# Patient Record
Sex: Female | Born: 1935 | Race: Black or African American | Hispanic: No | Smoking: Former smoker
Health system: Southern US, Community
[De-identification: ages and names within clinical notes are randomized; demographics above are authoritative.]

## PROBLEM LIST (undated history)

## (undated) DIAGNOSIS — E119 Type 2 diabetes mellitus without complications: Secondary | ICD-10-CM

## (undated) DIAGNOSIS — R159 Full incontinence of feces: Secondary | ICD-10-CM

## (undated) DIAGNOSIS — K589 Irritable bowel syndrome without diarrhea: Secondary | ICD-10-CM

## (undated) DIAGNOSIS — I10 Essential (primary) hypertension: Secondary | ICD-10-CM

## (undated) DIAGNOSIS — K5790 Diverticulosis of intestine, part unspecified, without perforation or abscess without bleeding: Secondary | ICD-10-CM

## (undated) DIAGNOSIS — C50919 Malignant neoplasm of unspecified site of unspecified female breast: Secondary | ICD-10-CM

## (undated) HISTORY — DX: Diverticulosis of intestine, part unspecified, without perforation or abscess without bleeding: K57.90

## (undated) HISTORY — PX: COLONOSCOPY: SHX174

## (undated) HISTORY — DX: Malignant neoplasm of unspecified site of unspecified female breast: C50.919

## (undated) HISTORY — DX: Type 2 diabetes mellitus without complications: E11.9

## (undated) HISTORY — DX: Irritable bowel syndrome, unspecified: K58.9

## (undated) HISTORY — DX: Full incontinence of feces: R15.9

## (undated) HISTORY — PX: BREAST LUMPECTOMY: SHX2

## (undated) HISTORY — PX: CHOLECYSTECTOMY: SHX55

## (undated) HISTORY — PX: APPENDECTOMY: SHX54

## (undated) HISTORY — DX: Essential (primary) hypertension: I10

---

## 1997-07-28 ENCOUNTER — Ambulatory Visit (HOSPITAL_COMMUNITY): Admission: RE | Admit: 1997-07-28 | Discharge: 1997-07-28 | Payer: Self-pay | Admitting: Internal Medicine

## 1997-10-14 ENCOUNTER — Other Ambulatory Visit: Admission: RE | Admit: 1997-10-14 | Discharge: 1997-10-14 | Payer: Self-pay | Admitting: *Deleted

## 1998-01-13 ENCOUNTER — Encounter: Payer: Self-pay | Admitting: Internal Medicine

## 1998-01-13 ENCOUNTER — Ambulatory Visit (HOSPITAL_COMMUNITY): Admission: RE | Admit: 1998-01-13 | Discharge: 1998-01-13 | Payer: Self-pay | Admitting: Internal Medicine

## 1998-12-07 ENCOUNTER — Encounter: Payer: Self-pay | Admitting: Internal Medicine

## 1998-12-07 ENCOUNTER — Ambulatory Visit (HOSPITAL_COMMUNITY): Admission: RE | Admit: 1998-12-07 | Discharge: 1998-12-07 | Payer: Self-pay | Admitting: Internal Medicine

## 1998-12-22 ENCOUNTER — Other Ambulatory Visit: Admission: RE | Admit: 1998-12-22 | Discharge: 1998-12-22 | Payer: Self-pay | Admitting: *Deleted

## 1999-02-17 ENCOUNTER — Other Ambulatory Visit: Admission: RE | Admit: 1999-02-17 | Discharge: 1999-02-17 | Payer: Self-pay | Admitting: *Deleted

## 1999-02-17 ENCOUNTER — Encounter (INDEPENDENT_AMBULATORY_CARE_PROVIDER_SITE_OTHER): Payer: Self-pay

## 1999-05-23 ENCOUNTER — Ambulatory Visit (HOSPITAL_COMMUNITY): Admission: RE | Admit: 1999-05-23 | Discharge: 1999-05-23 | Payer: Self-pay | Admitting: *Deleted

## 1999-05-23 ENCOUNTER — Encounter (INDEPENDENT_AMBULATORY_CARE_PROVIDER_SITE_OTHER): Payer: Self-pay | Admitting: Specialist

## 1999-12-13 ENCOUNTER — Encounter: Payer: Self-pay | Admitting: Internal Medicine

## 1999-12-13 ENCOUNTER — Ambulatory Visit (HOSPITAL_COMMUNITY): Admission: RE | Admit: 1999-12-13 | Discharge: 1999-12-13 | Payer: Self-pay | Admitting: Internal Medicine

## 1999-12-18 ENCOUNTER — Encounter: Admission: RE | Admit: 1999-12-18 | Discharge: 2000-03-17 | Payer: Self-pay | Admitting: Internal Medicine

## 2000-02-09 ENCOUNTER — Other Ambulatory Visit: Admission: RE | Admit: 2000-02-09 | Discharge: 2000-02-09 | Payer: Self-pay | Admitting: *Deleted

## 2000-09-02 ENCOUNTER — Encounter: Admission: RE | Admit: 2000-09-02 | Discharge: 2000-09-02 | Payer: Self-pay | Admitting: Internal Medicine

## 2000-09-02 ENCOUNTER — Encounter: Payer: Self-pay | Admitting: Internal Medicine

## 2001-01-14 ENCOUNTER — Encounter: Admission: RE | Admit: 2001-01-14 | Discharge: 2001-01-14 | Payer: Self-pay | Admitting: Internal Medicine

## 2001-01-14 ENCOUNTER — Encounter: Payer: Self-pay | Admitting: Internal Medicine

## 2001-01-22 ENCOUNTER — Encounter: Payer: Self-pay | Admitting: Internal Medicine

## 2001-01-22 ENCOUNTER — Encounter: Admission: RE | Admit: 2001-01-22 | Discharge: 2001-01-22 | Payer: Self-pay | Admitting: Internal Medicine

## 2001-04-29 ENCOUNTER — Other Ambulatory Visit: Admission: RE | Admit: 2001-04-29 | Discharge: 2001-04-29 | Payer: Self-pay | Admitting: *Deleted

## 2001-07-07 ENCOUNTER — Ambulatory Visit (HOSPITAL_COMMUNITY): Admission: RE | Admit: 2001-07-07 | Discharge: 2001-07-07 | Payer: Self-pay | Admitting: Gastroenterology

## 2002-03-26 ENCOUNTER — Encounter: Payer: Self-pay | Admitting: Internal Medicine

## 2002-03-26 ENCOUNTER — Encounter: Admission: RE | Admit: 2002-03-26 | Discharge: 2002-03-26 | Payer: Self-pay | Admitting: Internal Medicine

## 2002-09-03 ENCOUNTER — Other Ambulatory Visit: Admission: RE | Admit: 2002-09-03 | Discharge: 2002-09-03 | Payer: Self-pay | Admitting: *Deleted

## 2003-03-30 ENCOUNTER — Encounter: Admission: RE | Admit: 2003-03-30 | Discharge: 2003-03-30 | Payer: Self-pay | Admitting: Internal Medicine

## 2003-04-12 ENCOUNTER — Encounter: Admission: RE | Admit: 2003-04-12 | Discharge: 2003-04-12 | Payer: Self-pay | Admitting: Internal Medicine

## 2003-08-10 ENCOUNTER — Ambulatory Visit (HOSPITAL_COMMUNITY): Admission: RE | Admit: 2003-08-10 | Discharge: 2003-08-10 | Payer: Self-pay | Admitting: Cardiology

## 2003-10-20 ENCOUNTER — Encounter: Admission: RE | Admit: 2003-10-20 | Discharge: 2003-10-20 | Payer: Self-pay | Admitting: Internal Medicine

## 2004-01-31 ENCOUNTER — Ambulatory Visit (HOSPITAL_BASED_OUTPATIENT_CLINIC_OR_DEPARTMENT_OTHER): Admission: RE | Admit: 2004-01-31 | Discharge: 2004-01-31 | Payer: Self-pay | Admitting: *Deleted

## 2004-01-31 ENCOUNTER — Ambulatory Visit (HOSPITAL_COMMUNITY): Admission: RE | Admit: 2004-01-31 | Discharge: 2004-01-31 | Payer: Self-pay | Admitting: *Deleted

## 2004-01-31 ENCOUNTER — Encounter (INDEPENDENT_AMBULATORY_CARE_PROVIDER_SITE_OTHER): Payer: Self-pay | Admitting: Specialist

## 2004-03-17 ENCOUNTER — Ambulatory Visit (HOSPITAL_COMMUNITY): Admission: RE | Admit: 2004-03-17 | Discharge: 2004-03-17 | Payer: Self-pay | Admitting: Cardiology

## 2004-10-10 ENCOUNTER — Encounter: Admission: RE | Admit: 2004-10-10 | Discharge: 2004-10-10 | Payer: Self-pay | Admitting: Internal Medicine

## 2005-05-06 ENCOUNTER — Encounter: Admission: RE | Admit: 2005-05-06 | Discharge: 2005-05-06 | Payer: Self-pay | Admitting: General Surgery

## 2005-05-18 ENCOUNTER — Encounter: Admission: RE | Admit: 2005-05-18 | Discharge: 2005-05-18 | Payer: Self-pay | Admitting: General Surgery

## 2005-06-06 ENCOUNTER — Encounter (INDEPENDENT_AMBULATORY_CARE_PROVIDER_SITE_OTHER): Payer: Self-pay | Admitting: *Deleted

## 2005-06-06 ENCOUNTER — Ambulatory Visit (HOSPITAL_BASED_OUTPATIENT_CLINIC_OR_DEPARTMENT_OTHER): Admission: RE | Admit: 2005-06-06 | Discharge: 2005-06-06 | Payer: Self-pay | Admitting: General Surgery

## 2005-06-26 ENCOUNTER — Ambulatory Visit: Payer: Self-pay | Admitting: Oncology

## 2005-07-16 ENCOUNTER — Ambulatory Visit (HOSPITAL_COMMUNITY): Admission: RE | Admit: 2005-07-16 | Discharge: 2005-07-16 | Payer: Self-pay | Admitting: Oncology

## 2005-07-16 ENCOUNTER — Ambulatory Visit: Admission: RE | Admit: 2005-07-16 | Discharge: 2005-10-10 | Payer: Self-pay | Admitting: Radiation Oncology

## 2005-07-18 ENCOUNTER — Encounter (INDEPENDENT_AMBULATORY_CARE_PROVIDER_SITE_OTHER): Payer: Self-pay | Admitting: Specialist

## 2005-07-18 ENCOUNTER — Ambulatory Visit (HOSPITAL_BASED_OUTPATIENT_CLINIC_OR_DEPARTMENT_OTHER): Admission: RE | Admit: 2005-07-18 | Discharge: 2005-07-18 | Payer: Self-pay | Admitting: General Surgery

## 2005-08-31 ENCOUNTER — Ambulatory Visit: Payer: Self-pay | Admitting: Oncology

## 2005-10-24 ENCOUNTER — Ambulatory Visit: Payer: Self-pay | Admitting: Oncology

## 2005-10-26 LAB — CBC WITH DIFFERENTIAL/PLATELET
Basophils Absolute: 0 10*3/uL (ref 0.0–0.1)
EOS%: 3.3 % (ref 0.0–7.0)
Eosinophils Absolute: 0.1 10*3/uL (ref 0.0–0.5)
HGB: 11.8 g/dL (ref 11.6–15.9)
LYMPH%: 29.1 % (ref 14.0–48.0)
MCH: 33.4 pg (ref 26.0–34.0)
MCV: 98.5 fL (ref 81.0–101.0)
MONO%: 16.6 % — ABNORMAL HIGH (ref 0.0–13.0)
NEUT#: 1.8 10*3/uL (ref 1.5–6.5)
Platelets: 226 10*3/uL (ref 145–400)
RBC: 3.53 10*6/uL — ABNORMAL LOW (ref 3.70–5.32)
RDW: 13.3 % (ref 11.3–14.5)

## 2005-10-26 LAB — COMPREHENSIVE METABOLIC PANEL
AST: 22 U/L (ref 0–37)
Albumin: 4.4 g/dL (ref 3.5–5.2)
Alkaline Phosphatase: 60 U/L (ref 39–117)
BUN: 20 mg/dL (ref 6–23)
Potassium: 4.6 mEq/L (ref 3.5–5.3)
Sodium: 143 mEq/L (ref 135–145)
Total Bilirubin: 0.3 mg/dL (ref 0.3–1.2)

## 2006-01-17 ENCOUNTER — Ambulatory Visit: Payer: Self-pay | Admitting: Oncology

## 2006-01-22 LAB — COMPREHENSIVE METABOLIC PANEL
ALT: 36 U/L (ref 0–40)
AST: 27 U/L (ref 0–37)
CO2: 22 mEq/L (ref 19–32)
Calcium: 9.5 mg/dL (ref 8.4–10.5)
Chloride: 108 mEq/L (ref 96–112)
Creatinine, Ser: 1.09 mg/dL (ref 0.40–1.20)
Potassium: 4.1 mEq/L (ref 3.5–5.3)
Sodium: 141 mEq/L (ref 135–145)
Total Protein: 7.2 g/dL (ref 6.0–8.3)

## 2006-01-22 LAB — CBC WITH DIFFERENTIAL/PLATELET
BASO%: 0.4 % (ref 0.0–2.0)
EOS%: 3 % (ref 0.0–7.0)
HCT: 34.2 % — ABNORMAL LOW (ref 34.8–46.6)
MCH: 32.8 pg (ref 26.0–34.0)
MCHC: 33.3 g/dL (ref 32.0–36.0)
MONO#: 0.3 10*3/uL (ref 0.1–0.9)
NEUT%: 66.6 % (ref 39.6–76.8)
RBC: 3.47 10*6/uL — ABNORMAL LOW (ref 3.70–5.32)
RDW: 14.9 % — ABNORMAL HIGH (ref 11.3–14.5)
WBC: 4.9 10*3/uL (ref 3.9–10.0)
lymph#: 1.2 10*3/uL (ref 0.9–3.3)

## 2006-01-22 LAB — CANCER ANTIGEN 27.29: CA 27.29: 33 U/mL (ref 0–39)

## 2006-04-23 ENCOUNTER — Ambulatory Visit: Payer: Self-pay | Admitting: Oncology

## 2006-05-20 LAB — CBC WITH DIFFERENTIAL/PLATELET
BASO%: 0.4 % (ref 0.0–2.0)
MCHC: 33.4 g/dL (ref 32.0–36.0)
MONO#: 0.4 10*3/uL (ref 0.1–0.9)
RBC: 3.22 10*6/uL — ABNORMAL LOW (ref 3.70–5.32)
RDW: 13.8 % (ref 11.3–14.5)
WBC: 3.5 10*3/uL — ABNORMAL LOW (ref 3.9–10.0)
lymph#: 1.1 10*3/uL (ref 0.9–3.3)

## 2006-05-20 LAB — CANCER ANTIGEN 27.29: CA 27.29: 25 U/mL (ref 0–39)

## 2006-05-20 LAB — COMPREHENSIVE METABOLIC PANEL
ALT: 22 U/L (ref 0–35)
AST: 15 U/L (ref 0–37)
CO2: 22 mEq/L (ref 19–32)
Calcium: 9.5 mg/dL (ref 8.4–10.5)
Chloride: 111 mEq/L (ref 96–112)
Sodium: 144 mEq/L (ref 135–145)
Total Protein: 6.9 g/dL (ref 6.0–8.3)

## 2006-05-21 IMAGING — CR DG CHEST 2V
2 series · 2 of 2 positions shown · non-contrast
Comparison: 08/10/03.

CLINICAL DATA: Cough.  Question bronchitis. 
 TWO VIEW CHEST

[view not recorded (1 of 2)]
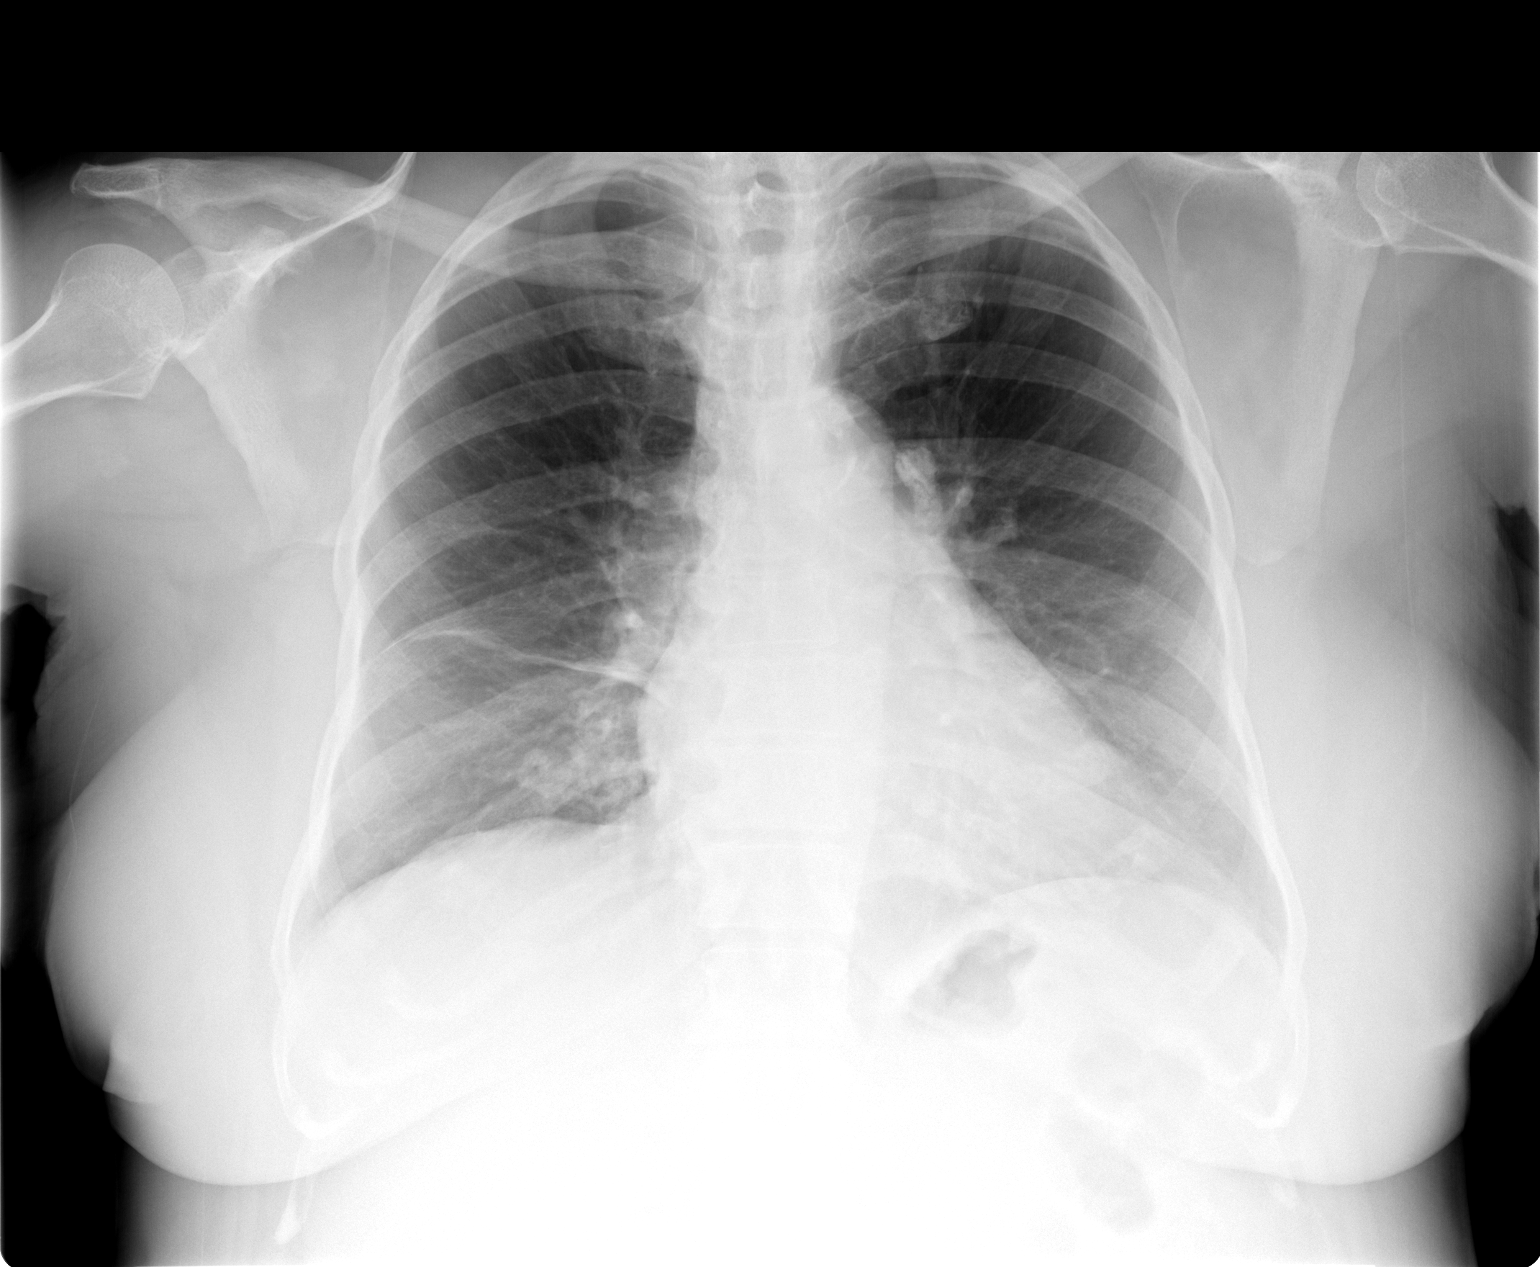

[view not recorded (2 of 2)]
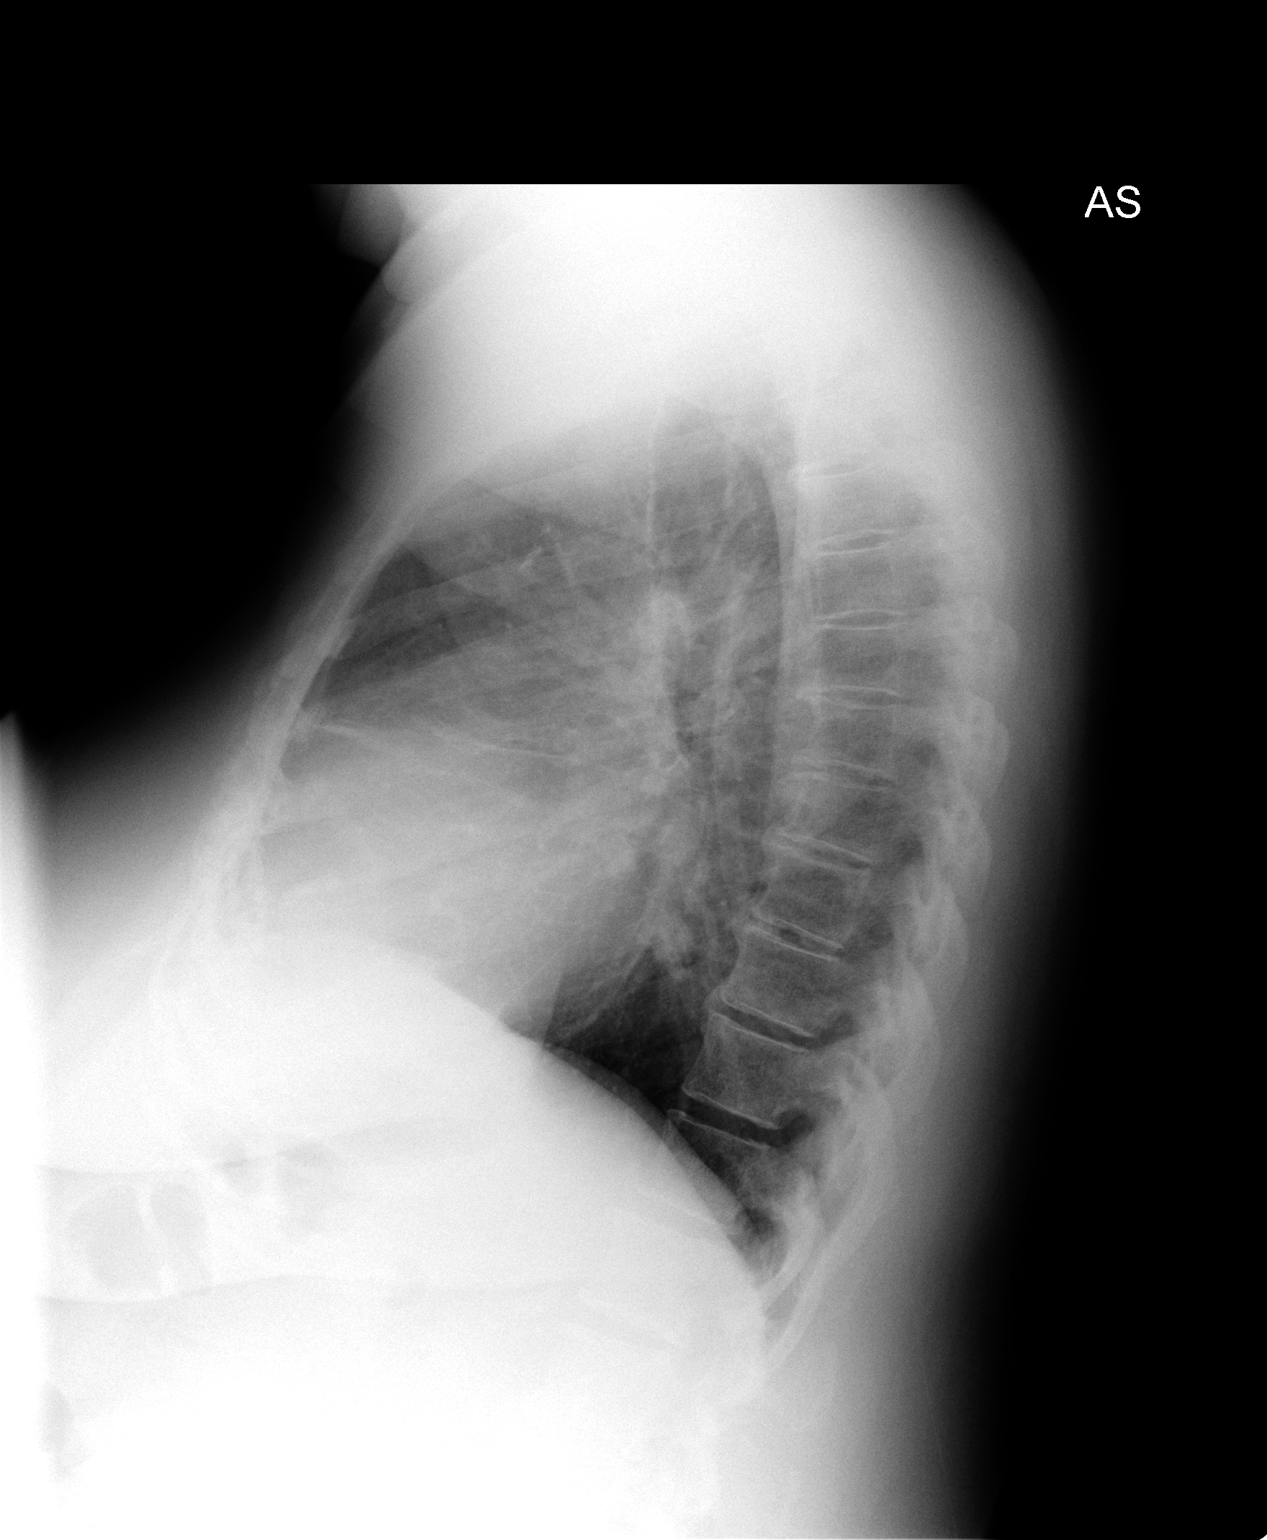

[2 of 2 positions shown; findings below may reference images not displayed]

There are linear atelectatic changes seen within the right middle lobe. There are accentuated bronchial markings ? unchanged.  There is borderline cardiomegaly present. There is a calcified left hilar lymph node again noted. 
 IMPRESSION
 Borderline cardiac size.  Mild linear atelectatic changes right middle lobe.

## 2006-06-10 ENCOUNTER — Other Ambulatory Visit: Admission: RE | Admit: 2006-06-10 | Discharge: 2006-06-10 | Payer: Self-pay | Admitting: Radiology

## 2006-09-02 ENCOUNTER — Ambulatory Visit: Payer: Self-pay | Admitting: Oncology

## 2006-09-05 LAB — COMPREHENSIVE METABOLIC PANEL
ALT: 22 U/L (ref 0–35)
AST: 15 U/L (ref 0–37)
Albumin: 4.1 g/dL (ref 3.5–5.2)
Alkaline Phosphatase: 47 U/L (ref 39–117)
BUN: 24 mg/dL — ABNORMAL HIGH (ref 6–23)
Calcium: 9.8 mg/dL (ref 8.4–10.5)
Chloride: 106 mEq/L (ref 96–112)
Potassium: 4.3 mEq/L (ref 3.5–5.3)
Sodium: 138 mEq/L (ref 135–145)
Total Protein: 6.6 g/dL (ref 6.0–8.3)

## 2006-09-05 LAB — CBC WITH DIFFERENTIAL/PLATELET
Basophils Absolute: 0 10*3/uL (ref 0.0–0.1)
Eosinophils Absolute: 0.2 10*3/uL (ref 0.0–0.5)
HCT: 31.3 % — ABNORMAL LOW (ref 34.8–46.6)
LYMPH%: 28.9 % (ref 14.0–48.0)
MCV: 98.5 fL (ref 81.0–101.0)
MONO#: 0.3 10*3/uL (ref 0.1–0.9)
MONO%: 8 % (ref 0.0–13.0)
NEUT#: 2.5 10*3/uL (ref 1.5–6.5)
NEUT%: 58.3 % (ref 39.6–76.8)
Platelets: 243 10*3/uL (ref 145–400)
RBC: 3.17 10*6/uL — ABNORMAL LOW (ref 3.70–5.32)

## 2006-09-05 LAB — IRON AND TIBC
%SAT: 25 % (ref 20–55)
Iron: 85 ug/dL (ref 42–145)
TIBC: 344 ug/dL (ref 250–470)
UIBC: 259 ug/dL

## 2006-09-05 LAB — CHCC SMEAR

## 2006-09-18 ENCOUNTER — Ambulatory Visit (HOSPITAL_COMMUNITY): Admission: RE | Admit: 2006-09-18 | Discharge: 2006-09-18 | Payer: Self-pay | Admitting: Oncology

## 2007-01-03 ENCOUNTER — Ambulatory Visit: Payer: Self-pay | Admitting: Oncology

## 2007-01-07 LAB — CBC WITH DIFFERENTIAL/PLATELET
BASO%: 0.5 % (ref 0.0–2.0)
HCT: 32.4 % — ABNORMAL LOW (ref 34.8–46.6)
LYMPH%: 21.5 % (ref 14.0–48.0)
MCH: 33.6 pg (ref 26.0–34.0)
MCHC: 33.9 g/dL (ref 32.0–36.0)
MCV: 99 fL (ref 81.0–101.0)
MONO%: 8.5 % (ref 0.0–13.0)
NEUT%: 66.2 % (ref 39.6–76.8)
Platelets: 207 10*3/uL (ref 145–400)
RBC: 3.27 10*6/uL — ABNORMAL LOW (ref 3.70–5.32)

## 2007-01-07 LAB — COMPREHENSIVE METABOLIC PANEL
ALT: 27 U/L (ref 0–35)
AST: 17 U/L (ref 0–37)
Albumin: 4.2 g/dL (ref 3.5–5.2)
Alkaline Phosphatase: 58 U/L (ref 39–117)
Glucose, Bld: 161 mg/dL — ABNORMAL HIGH (ref 70–99)
Potassium: 3.9 mEq/L (ref 3.5–5.3)
Sodium: 144 mEq/L (ref 135–145)
Total Protein: 6.7 g/dL (ref 6.0–8.3)

## 2007-04-30 ENCOUNTER — Ambulatory Visit: Payer: Self-pay | Admitting: Oncology

## 2007-05-02 LAB — COMPREHENSIVE METABOLIC PANEL
ALT: 37 U/L — ABNORMAL HIGH (ref 0–35)
AST: 25 U/L (ref 0–37)
BUN: 12 mg/dL (ref 6–23)
Calcium: 9.2 mg/dL (ref 8.4–10.5)
Creatinine, Ser: 0.88 mg/dL (ref 0.40–1.20)
Total Bilirubin: 0.3 mg/dL (ref 0.3–1.2)

## 2007-05-02 LAB — CANCER ANTIGEN 27.29: CA 27.29: 26 U/mL (ref 0–39)

## 2007-05-02 LAB — LACTATE DEHYDROGENASE: LDH: 164 U/L (ref 94–250)

## 2007-05-02 LAB — CBC WITH DIFFERENTIAL/PLATELET
BASO%: 0.5 % (ref 0.0–2.0)
Basophils Absolute: 0 10*3/uL (ref 0.0–0.1)
EOS%: 2.8 % (ref 0.0–7.0)
HCT: 32.2 % — ABNORMAL LOW (ref 34.8–46.6)
HGB: 10.9 g/dL — ABNORMAL LOW (ref 11.6–15.9)
LYMPH%: 40.5 % (ref 14.0–48.0)
MCH: 33.3 pg (ref 26.0–34.0)
MCHC: 34 g/dL (ref 32.0–36.0)
MCV: 97.8 fL (ref 81.0–101.0)
NEUT%: 43.9 % (ref 39.6–76.8)
Platelets: 242 10*3/uL (ref 145–400)

## 2007-05-06 LAB — VITAMIN D PNL(25-HYDRXY+1,25-DIHY)-BLD: Vit D, 1,25-Dihydroxy: 25 pg/mL (ref 6–62)

## 2007-06-09 ENCOUNTER — Encounter: Admission: RE | Admit: 2007-06-09 | Discharge: 2007-06-09 | Payer: Self-pay | Admitting: Oncology

## 2007-09-09 ENCOUNTER — Ambulatory Visit: Payer: Self-pay | Admitting: Oncology

## 2007-09-11 LAB — CBC & DIFF AND RETIC
Eosinophils Absolute: 0.2 10*3/uL (ref 0.0–0.5)
MONO#: 0.4 10*3/uL (ref 0.1–0.9)
MONO%: 10.1 % (ref 0.0–13.0)
NEUT#: 2.2 10*3/uL (ref 1.5–6.5)
RBC: 3.16 10*6/uL — ABNORMAL LOW (ref 3.70–5.32)
RDW: 13.4 % (ref 11.3–14.5)
RETIC #: 83.4 10*3/uL (ref 19.7–115.1)
Retic %: 2.6 % — ABNORMAL HIGH (ref 0.4–2.3)
WBC: 4.2 10*3/uL (ref 3.9–10.0)
lymph#: 1.3 10*3/uL (ref 0.9–3.3)

## 2007-09-15 LAB — FERRITIN: Ferritin: 27 ng/mL (ref 10–291)

## 2007-09-15 LAB — COMPREHENSIVE METABOLIC PANEL
AST: 19 U/L (ref 0–37)
Albumin: 4.1 g/dL (ref 3.5–5.2)
Alkaline Phosphatase: 48 U/L (ref 39–117)
BUN: 19 mg/dL (ref 6–23)
Potassium: 4.1 mEq/L (ref 3.5–5.3)

## 2007-09-15 LAB — IRON AND TIBC
%SAT: 29 % (ref 20–55)
Iron: 110 ug/dL (ref 42–145)
UIBC: 274 ug/dL

## 2007-09-15 LAB — PROTEIN ELECTROPHORESIS, SERUM
Alpha-1-Globulin: 4.2 % (ref 2.9–4.9)
Gamma Globulin: 13 % (ref 11.1–18.8)

## 2007-11-03 ENCOUNTER — Emergency Department (HOSPITAL_COMMUNITY): Admission: EM | Admit: 2007-11-03 | Discharge: 2007-11-03 | Payer: Self-pay | Admitting: Emergency Medicine

## 2008-03-10 ENCOUNTER — Ambulatory Visit: Payer: Self-pay | Admitting: Oncology

## 2008-03-12 LAB — CBC & DIFF AND RETIC
Basophils Absolute: 0 10*3/uL (ref 0.0–0.1)
Eosinophils Absolute: 0.1 10*3/uL (ref 0.0–0.5)
HCT: 32.4 % — ABNORMAL LOW (ref 34.8–46.6)
HGB: 11 g/dL — ABNORMAL LOW (ref 11.6–15.9)
LYMPH%: 31 % (ref 14.0–48.0)
MCV: 100.2 fL (ref 81.0–101.0)
MONO#: 0.5 10*3/uL (ref 0.1–0.9)
MONO%: 11.1 % (ref 0.0–13.0)
NEUT#: 2.2 10*3/uL (ref 1.5–6.5)
NEUT%: 54.4 % (ref 39.6–76.8)
Platelets: 202 10*3/uL (ref 145–400)
Retic %: 3.2 % — ABNORMAL HIGH (ref 0.4–2.3)
WBC: 4.1 10*3/uL (ref 3.9–10.0)

## 2008-03-15 LAB — COMPREHENSIVE METABOLIC PANEL
Albumin: 4.3 g/dL (ref 3.5–5.2)
Alkaline Phosphatase: 51 U/L (ref 39–117)
BUN: 21 mg/dL (ref 6–23)
CO2: 19 mEq/L (ref 19–32)
Calcium: 9 mg/dL (ref 8.4–10.5)
Chloride: 109 mEq/L (ref 96–112)
Glucose, Bld: 204 mg/dL — ABNORMAL HIGH (ref 70–99)
Potassium: 4.4 mEq/L (ref 3.5–5.3)
Sodium: 139 mEq/L (ref 135–145)
Total Protein: 7.1 g/dL (ref 6.0–8.3)

## 2008-03-15 LAB — IRON AND TIBC: TIBC: 379 ug/dL (ref 250–470)

## 2008-03-15 LAB — FOLATE: Folate: >20 ng/mL

## 2008-03-15 LAB — ERYTHROPOIETIN: Erythropoietin: 25.3 m[IU]/mL (ref 2.6–34.0)

## 2008-03-15 LAB — CANCER ANTIGEN 27.29: CA 27.29: 39 U/mL (ref 0–39)

## 2008-03-15 LAB — VITAMIN B12: Vitamin B-12: 459 pg/mL (ref 211–911)

## 2008-04-09 ENCOUNTER — Ambulatory Visit (HOSPITAL_COMMUNITY): Admission: RE | Admit: 2008-04-09 | Discharge: 2008-04-10 | Payer: Self-pay | Admitting: General Surgery

## 2008-09-06 ENCOUNTER — Ambulatory Visit: Payer: Self-pay | Admitting: Oncology

## 2008-09-08 LAB — CBC WITH DIFFERENTIAL/PLATELET
BASO%: 0.4 % (ref 0.0–2.0)
EOS%: 4.5 % (ref 0.0–7.0)
Eosinophils Absolute: 0.2 10*3/uL (ref 0.0–0.5)
LYMPH%: 33.2 % (ref 14.0–49.7)
MCH: 32.7 pg (ref 25.1–34.0)
MCHC: 33.1 g/dL (ref 31.5–36.0)
MCV: 98.8 fL (ref 79.5–101.0)
MONO%: 10.3 % (ref 0.0–14.0)
NEUT#: 2 10*3/uL (ref 1.5–6.5)
Platelets: 248 10*3/uL (ref 145–400)
RBC: 3.14 10*6/uL — ABNORMAL LOW (ref 3.70–5.45)
RDW: 13.2 % (ref 11.2–14.5)

## 2008-09-09 LAB — COMPREHENSIVE METABOLIC PANEL
ALT: 20 U/L (ref 0–35)
AST: 16 U/L (ref 0–37)
Albumin: 3.9 g/dL (ref 3.5–5.2)
Alkaline Phosphatase: 55 U/L (ref 39–117)
Glucose, Bld: 158 mg/dL — ABNORMAL HIGH (ref 70–99)
Potassium: 3.8 mEq/L (ref 3.5–5.3)
Sodium: 142 mEq/L (ref 135–145)
Total Bilirubin: 0.2 mg/dL — ABNORMAL LOW (ref 0.3–1.2)
Total Protein: 6.4 g/dL (ref 6.0–8.3)

## 2008-09-09 LAB — VITAMIN D 25 HYDROXY (VIT D DEFICIENCY, FRACTURES): Vit D, 25-Hydroxy: 64 ng/mL (ref 30–89)

## 2008-09-09 LAB — CANCER ANTIGEN 27.29: CA 27.29: 36 U/mL (ref 0–39)

## 2008-10-14 ENCOUNTER — Other Ambulatory Visit: Admission: RE | Admit: 2008-10-14 | Discharge: 2008-10-14 | Payer: Self-pay | Admitting: Internal Medicine

## 2008-12-21 ENCOUNTER — Ambulatory Visit: Payer: Self-pay | Admitting: Oncology

## 2008-12-23 LAB — CBC & DIFF AND RETIC
BASO%: 0.4 % (ref 0.0–2.0)
EOS%: 2.4 % (ref 0.0–7.0)
MCH: 31.9 pg (ref 25.1–34.0)
MCHC: 32.5 g/dL (ref 31.5–36.0)
RBC: 3.42 10*6/uL — ABNORMAL LOW (ref 3.70–5.45)
RDW: 13 % (ref 11.2–14.5)
Retic %: 2.12 % — ABNORMAL HIGH (ref 0.50–1.50)
Retic Ct Abs: 72.5 10*3/uL (ref 18.30–72.70)
lymph#: 1.7 10*3/uL (ref 0.9–3.3)
nRBC: 0 % (ref 0–0)

## 2008-12-23 LAB — CHCC SMEAR

## 2008-12-23 LAB — MORPHOLOGY

## 2008-12-24 LAB — IRON AND TIBC: UIBC: 289 ug/dL

## 2008-12-24 LAB — FOLATE RBC: RBC Folate: 886 ng/mL — ABNORMAL HIGH (ref 180–600)

## 2009-03-23 ENCOUNTER — Ambulatory Visit: Payer: Self-pay | Admitting: Oncology

## 2009-03-25 LAB — CBC & DIFF AND RETIC
BASO%: 0.4 % (ref 0.0–2.0)
EOS%: 3.4 % (ref 0.0–7.0)
HCT: 32.9 % — ABNORMAL LOW (ref 34.8–46.6)
MCH: 31.7 pg (ref 25.1–34.0)
MCHC: 31.9 g/dL (ref 31.5–36.0)
NEUT%: 56.7 % (ref 38.4–76.8)
RBC: 3.31 10*6/uL — ABNORMAL LOW (ref 3.70–5.45)
Retic Ct Abs: 102.61 10*3/uL — ABNORMAL HIGH (ref 18.30–72.70)
WBC: 4.7 10*3/uL (ref 3.9–10.3)
lymph#: 1.5 10*3/uL (ref 0.9–3.3)
nRBC: 0 % (ref 0–0)

## 2009-03-25 LAB — CHCC SMEAR

## 2009-03-25 LAB — MORPHOLOGY: PLT EST: ADEQUATE

## 2009-03-28 LAB — COMPREHENSIVE METABOLIC PANEL
AST: 14 U/L (ref 0–37)
BUN: 16 mg/dL (ref 6–23)
CO2: 20 mEq/L (ref 19–32)
Calcium: 8.9 mg/dL (ref 8.4–10.5)
Chloride: 108 mEq/L (ref 96–112)
Creatinine, Ser: 1 mg/dL (ref 0.40–1.20)
Total Bilirubin: 0.2 mg/dL — ABNORMAL LOW (ref 0.3–1.2)

## 2009-03-28 LAB — IRON AND TIBC: Iron: 70 ug/dL (ref 42–145)

## 2009-03-28 LAB — FERRITIN: Ferritin: 61 ng/mL (ref 10–291)

## 2009-03-28 LAB — FOLATE RBC: RBC Folate: 689 ng/mL — ABNORMAL HIGH (ref 180–600)

## 2009-03-28 LAB — LACTATE DEHYDROGENASE: LDH: 168 U/L (ref 94–250)

## 2009-04-01 ENCOUNTER — Ambulatory Visit (HOSPITAL_COMMUNITY): Admission: RE | Admit: 2009-04-01 | Discharge: 2009-04-01 | Payer: Self-pay | Admitting: Oncology

## 2009-04-19 ENCOUNTER — Inpatient Hospital Stay (HOSPITAL_BASED_OUTPATIENT_CLINIC_OR_DEPARTMENT_OTHER): Admission: RE | Admit: 2009-04-19 | Discharge: 2009-04-19 | Payer: Self-pay | Admitting: Cardiology

## 2009-04-19 IMAGING — CR DG HIP (WITH OR WITHOUT PELVIS) 2-3V*L*
3 series · 3 of 3 positions shown · non-contrast
Comparison: none

CLINICAL DATA: Left hip pain getting progressively worse.  History of breast cancer.
 LEFT HIP ?2 VIEWS:
 There is no evidence of arthritis or bone destruction, or joint space narrowing.  There is some spurring on the greater tuberosity but this is similar to the opposite side.  I suspect the patient may have some chronic gluteus medius tendonitis with some spurring at the insertion on the greater tuberosity.  Is the patient tender over the greater trochanteric bursa region?
 An AP view of the pelvis demonstrates no significant abnormality of the pelvic bones.  The sacroiliac joints are normal.  There are some arterial vascular calcifications.

[t pelvis a.p.]
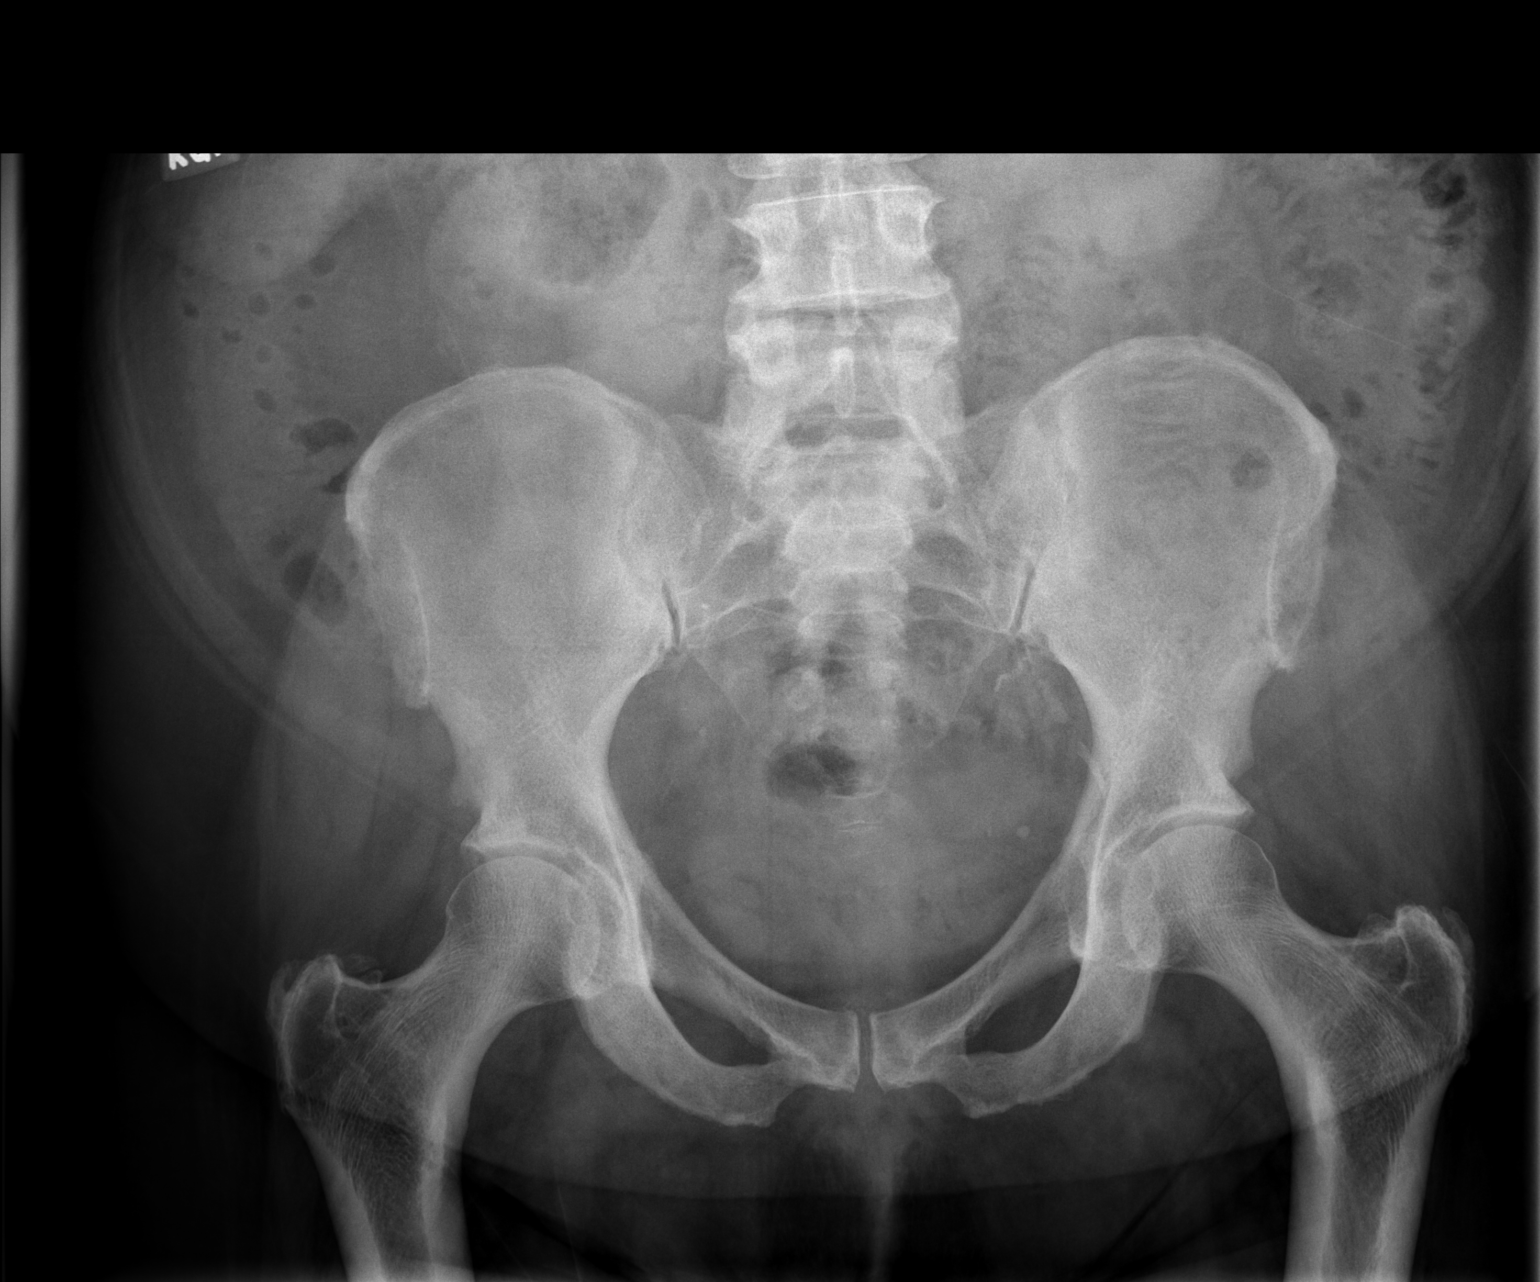

[t hip ap left *]
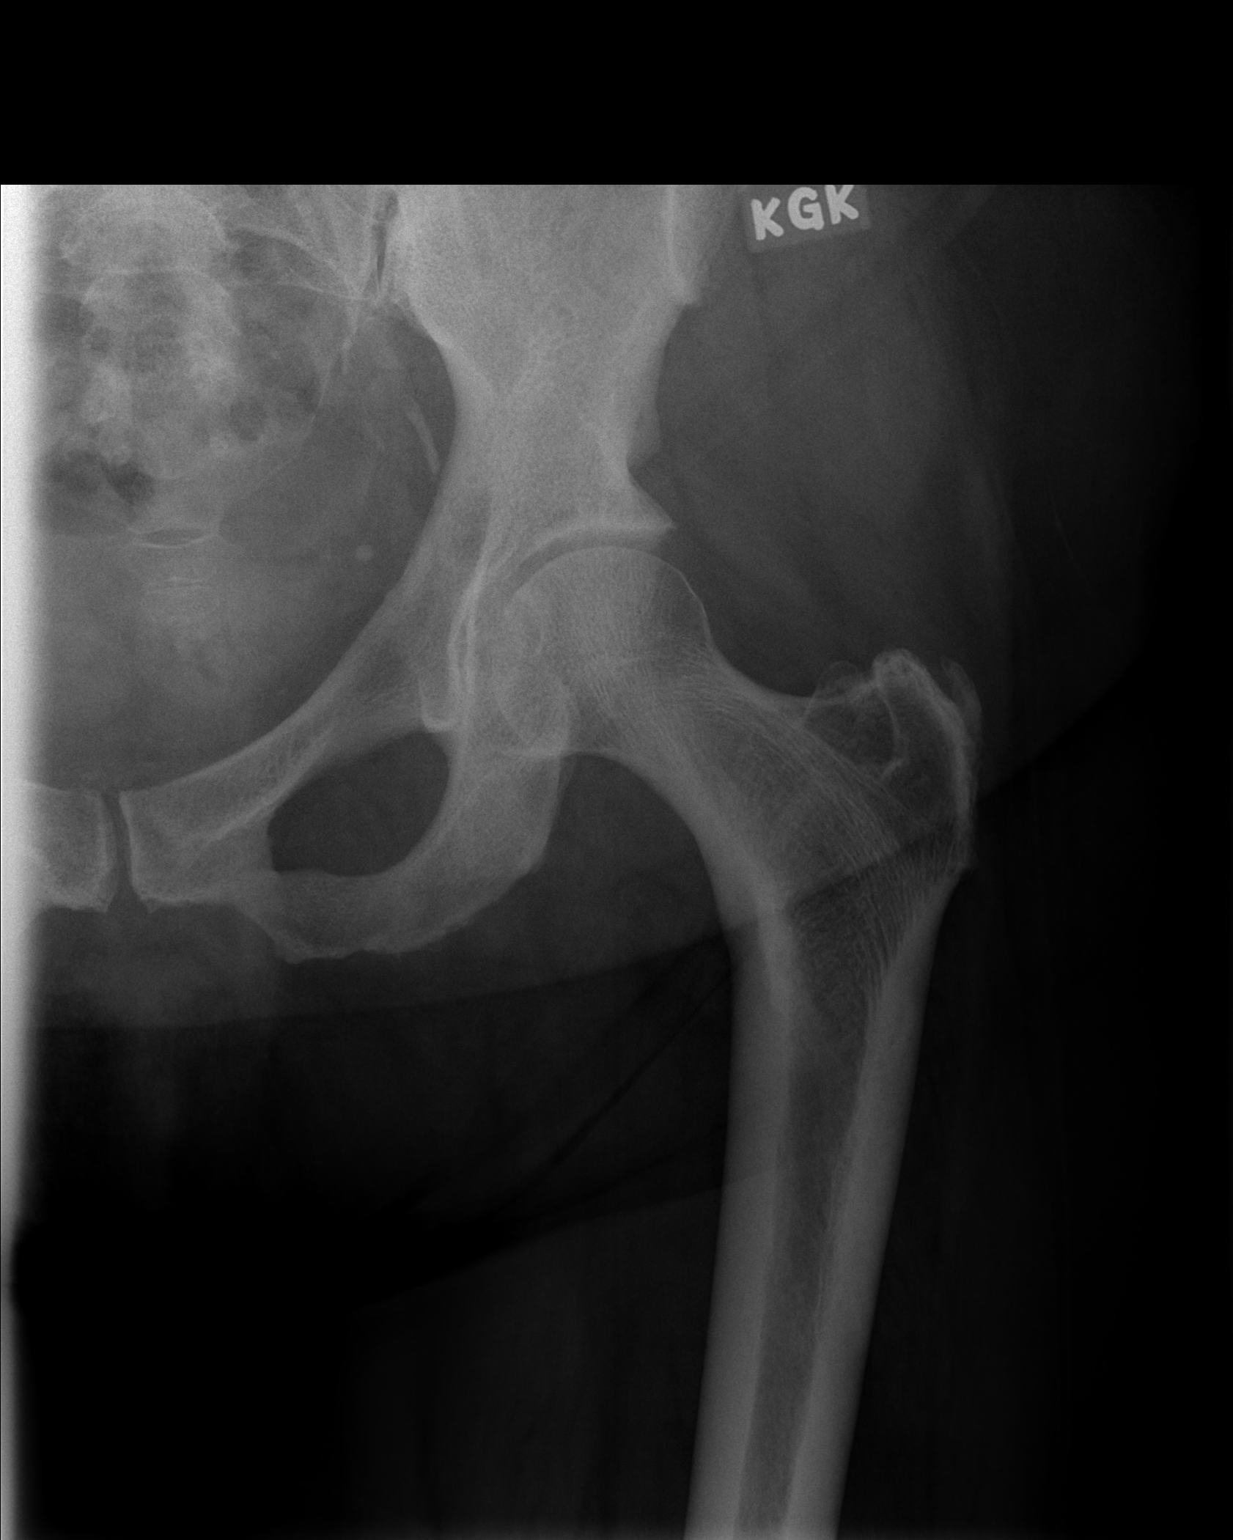

[t hip frog leg left *]
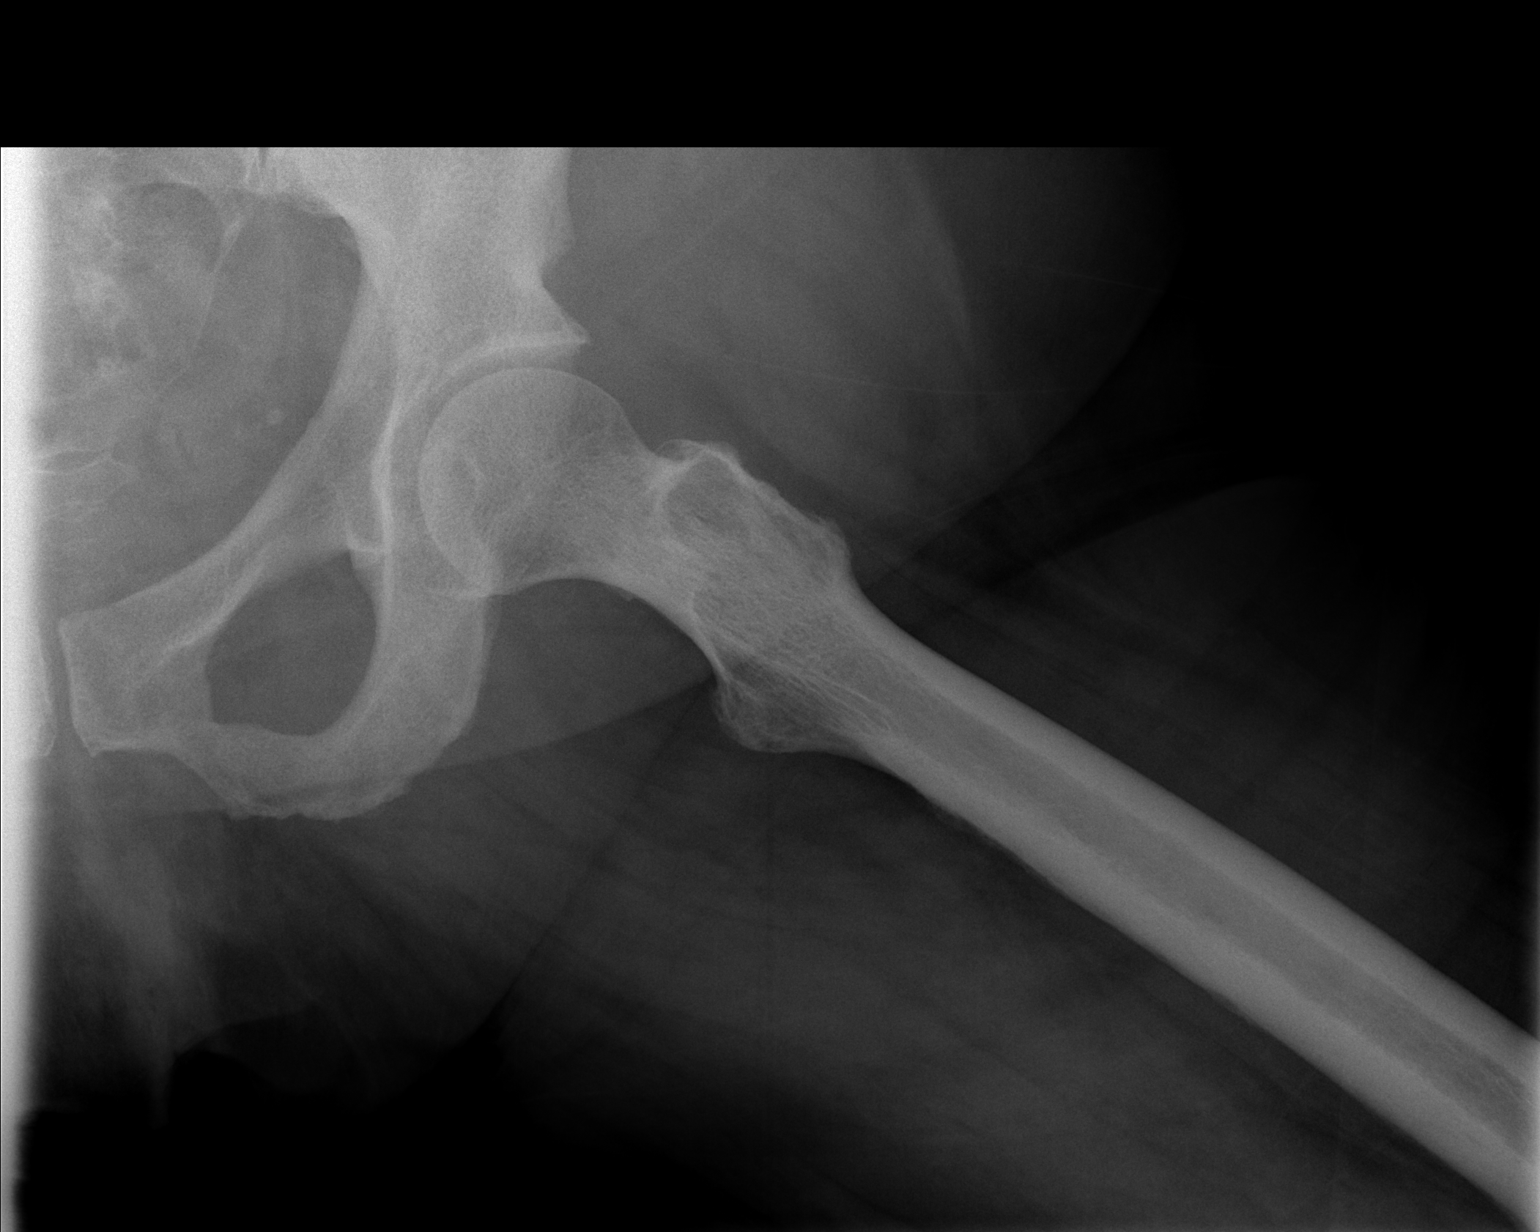

[3 of 3 positions shown; findings below may reference images not displayed]

IMPRESSION: Calcifications in the soft tissues at the insertion of the gluteus medius tendon on the greater trochanter as described.  This can be seen with chronic tendonitis or tendinosis as described.  Bony structures of the hip and pelvis otherwise appear essentially normal.

## 2009-09-22 ENCOUNTER — Ambulatory Visit: Payer: Self-pay | Admitting: Oncology

## 2009-09-22 LAB — CANCER ANTIGEN 27.29: CA 27.29: 37 U/mL (ref 0–39)

## 2009-09-22 LAB — CBC WITH DIFFERENTIAL/PLATELET
BASO%: 0.2 % (ref 0.0–2.0)
Basophils Absolute: 0 10*3/uL (ref 0.0–0.1)
EOS%: 1.9 % (ref 0.0–7.0)
Eosinophils Absolute: 0.1 10*3/uL (ref 0.0–0.5)
HCT: 34.8 % (ref 34.8–46.6)
HGB: 11.3 g/dL — ABNORMAL LOW (ref 11.6–15.9)
LYMPH%: 36.7 % (ref 14.0–49.7)
MCH: 31.9 pg (ref 25.1–34.0)
MCHC: 32.5 g/dL (ref 31.5–36.0)
MCV: 98.3 fL (ref 79.5–101.0)
MONO#: 0.4 10*3/uL (ref 0.1–0.9)
MONO%: 9.2 % (ref 0.0–14.0)
NEUT#: 2.4 10*3/uL (ref 1.5–6.5)
NEUT%: 52 % (ref 38.4–76.8)
Platelets: 180 10*3/uL (ref 145–400)
RBC: 3.54 10*6/uL — ABNORMAL LOW (ref 3.70–5.45)
RDW: 12.7 % (ref 11.2–14.5)
WBC: 4.7 10*3/uL (ref 3.9–10.3)
lymph#: 1.7 10*3/uL (ref 0.9–3.3)
nRBC: 0 % (ref 0–0)

## 2009-09-22 LAB — COMPREHENSIVE METABOLIC PANEL
ALT: 21 U/L (ref 0–35)
AST: 21 U/L (ref 0–37)
Albumin: 4.3 g/dL (ref 3.5–5.2)
Alkaline Phosphatase: 65 U/L (ref 39–117)
BUN: 17 mg/dL (ref 6–23)
CO2: 23 mEq/L (ref 19–32)
Calcium: 9.1 mg/dL (ref 8.4–10.5)
Chloride: 110 mEq/L (ref 96–112)
Creatinine, Ser: 0.89 mg/dL (ref 0.40–1.20)
Glucose, Bld: 152 mg/dL — ABNORMAL HIGH (ref 70–99)
Potassium: 3.4 mEq/L — ABNORMAL LOW (ref 3.5–5.3)
Sodium: 144 mEq/L (ref 135–145)
Total Bilirubin: 0.3 mg/dL (ref 0.3–1.2)
Total Protein: 7.3 g/dL (ref 6.0–8.3)

## 2009-09-22 LAB — VITAMIN D 25 HYDROXY (VIT D DEFICIENCY, FRACTURES): Vit D, 25-Hydroxy: 44 ng/mL (ref 30–89)

## 2009-09-22 LAB — LACTATE DEHYDROGENASE: LDH: 180 U/L (ref 94–250)

## 2009-12-29 ENCOUNTER — Encounter (HOSPITAL_COMMUNITY): Admission: RE | Admit: 2009-12-29 | Discharge: 2010-02-15 | Payer: Self-pay | Admitting: Cardiology

## 2010-03-27 ENCOUNTER — Encounter: Admission: RE | Admit: 2010-03-27 | Discharge: 2010-03-27 | Payer: Self-pay | Admitting: Internal Medicine

## 2010-03-29 ENCOUNTER — Ambulatory Visit: Payer: Self-pay | Admitting: Oncology

## 2010-03-31 LAB — CBC WITH DIFFERENTIAL/PLATELET
BASO%: 0.5 % (ref 0.0–2.0)
Basophils Absolute: 0 10*3/uL (ref 0.0–0.1)
EOS%: 3 % (ref 0.0–7.0)
Eosinophils Absolute: 0.1 10*3/uL (ref 0.0–0.5)
HCT: 31.9 % — ABNORMAL LOW (ref 34.8–46.6)
HGB: 10.6 g/dL — ABNORMAL LOW (ref 11.6–15.9)
LYMPH%: 35 % (ref 14.0–49.7)
MCH: 34.6 pg — ABNORMAL HIGH (ref 25.1–34.0)
MCHC: 33.1 g/dL (ref 31.5–36.0)
MCV: 104.3 fL — ABNORMAL HIGH (ref 79.5–101.0)
MONO#: 0.4 10*3/uL (ref 0.1–0.9)
MONO%: 8.6 % (ref 0.0–14.0)
NEUT#: 2.2 10*3/uL (ref 1.5–6.5)
NEUT%: 52.9 % (ref 38.4–76.8)
Platelets: 237 10*3/uL (ref 145–400)
RBC: 3.06 10*6/uL — ABNORMAL LOW (ref 3.70–5.45)
RDW: 13.2 % (ref 11.2–14.5)
WBC: 4.3 10*3/uL (ref 3.9–10.3)
lymph#: 1.5 10*3/uL (ref 0.9–3.3)

## 2010-04-01 LAB — COMPREHENSIVE METABOLIC PANEL
ALT: 27 U/L (ref 0–35)
AST: 22 U/L (ref 0–37)
Albumin: 4.2 g/dL (ref 3.5–5.2)
Alkaline Phosphatase: 48 U/L (ref 39–117)
BUN: 18 mg/dL (ref 6–23)
CO2: 23 mEq/L (ref 19–32)
Calcium: 9.6 mg/dL (ref 8.4–10.5)
Chloride: 111 mEq/L (ref 96–112)
Creatinine, Ser: 1.07 mg/dL (ref 0.40–1.20)
Glucose, Bld: 125 mg/dL — ABNORMAL HIGH (ref 70–99)
Potassium: 4.5 mEq/L (ref 3.5–5.3)
Sodium: 143 mEq/L (ref 135–145)
Total Bilirubin: 0.2 mg/dL — ABNORMAL LOW (ref 0.3–1.2)
Total Protein: 7.2 g/dL (ref 6.0–8.3)

## 2010-04-01 LAB — VITAMIN D 25 HYDROXY (VIT D DEFICIENCY, FRACTURES): Vit D, 25-Hydroxy: 44 ng/mL (ref 30–89)

## 2010-04-01 LAB — LACTATE DEHYDROGENASE: LDH: 178 U/L (ref 94–250)

## 2010-04-01 LAB — CANCER ANTIGEN 27.29: CA 27.29: 31 U/mL (ref 0–39)

## 2010-06-10 ENCOUNTER — Encounter: Payer: Self-pay | Admitting: Internal Medicine

## 2010-08-04 ENCOUNTER — Other Ambulatory Visit: Payer: Self-pay | Admitting: Gastroenterology

## 2010-10-03 NOTE — Op Note (Signed)
[name], [name] NO.:  [id number]   MEDICAL RECORD NO.:  [id number]          PATIENT TYPE:  OIB   LOCATION:  5123                         FACILITY:  [facility]   PHYSICIAN:  Leonie Man, M.D.   DATE OF BIRTH:  [birth year]   DATE OF PROCEDURE:  04/09/2008  DATE OF DISCHARGE:                               OPERATIVE REPORT   PREOPERATIVE DIAGNOSIS:  Ventral hernia.   POSTOPERATIVE DIAGNOSIS:  Ventral hernia.   PROCEDURE:  Repair of ventral hernia with mesh (Ventralex, large).   SURGEON:  Leonie Man, MD   ASSISTANT:  OR nurse.   ANESTHESIA:  General.   NOTE:  Ms. Sara Reilly is a 77 year old patient with an enlarging  ventral hernia just superior to the umbilicus in an old upper abdominal  incision.  The patient comes to the operating room now after the risks  and potential benefits of surgery had been discussed, all questions  answered, and consent for surgery obtained.   PROCEDURE:  The patient positioned supinely.  Following the induction of  satisfactory general anesthesia, the abdomen was prepped and draped to  be included in a sterile operative field.  The patient was identified as  Sara Reilly, procedure to be done ventral hernia repair with mesh.  No  airway problems were noted.  The patient had appropriate preoperative  antibiotics.   All members of the operating room team are identified and the rolls  recorded.   I made an incision in the old upper abdominal scar over the hernia,  deepening this through the skin and subcutaneous tissue, and carrying  the dissection down to the hernia.  The hernia itself was dissected free  on all sides and carried down to the fascia.  The attachments of the  fascia were then released.  Incarcerated fat was removed between clamps  and secured with ties of 2-0 silk.  A portion of the transverse colon  was noted to be within the hernia.  This was reduced back into the  peritoneal cavity.  The adhesions  around the hernial defect were then  cleared and a large Ventralex patch was introduced into the wound.  This  was sewn in at the 12 o'clock, 6 o'clock, 3 o'clock, 9 o'clock, 10  o'clock, 4 o'clock, 7 o'clock, and 2 o'clock axes with #1 Novofil  sutures.  The tails of the mesh were also sewn in onto this fascial  edge.  The fascia was then closed over the mesh with interrupted figure-  of-eight sutures of #1 Novofil.  All areas of dissection checked for  hemostasis.  Sponge and instrument counts verified.  The  subcutaneous tissues closed with running 2-0 Vicryl and the skin was  closed with running 4-0 Monocryl suture and reinforced with Steri-Strips  and Dermabond.  The anesthetic reversed.  The patient was moved from the  operating room to the recovery room in stable condition.  She tolerated  the procedure well.      Leonie Man, M.D.  Electronically Signed     PB/MEDQ  D:  04/09/2008  T:  04/09/2008  Job:  [id number] 

## 2010-10-06 NOTE — Op Note (Signed)
Sara Reilly, Sara Reilly NO.:  [id number]   MEDICAL RECORD NO.:  [id number]          PATIENT TYPE:  AMB   LOCATION:  DSC                          FACILITY:  [facility]   PHYSICIAN:  Leonie Man, M.D.   DATE OF BIRTH:  22-Nov-1929   DATE OF PROCEDURE:  07/18/2005  DATE OF DISCHARGE:                                 OPERATIVE REPORT   PREOPERATIVE DIAGNOSIS:  Carcinoma of the right breast status post excision  with positive 12 o'clock margin.   POSTOPERATIVE DIAGNOSIS:  Carcinoma of the right breast status post excision  with positive 12 o'clock margin.   PROCEDURE:  Re-excision right breast carcinoma for positive 12 o'clock  margin.   SURGEON:  Ballen.   ASSISTANT:  OR tech.   ANESTHESIA:  General.   NOTE:  [name] is a 77 year old female who was diagnosed to have an  infiltrating lobular carcinoma of the right breast designated as a grade 1  ER positive, PR negative tumor measuring a total of 2.1 cm with a low  proliferation rate. Her sentinel lymph nodes were negative both on H and E  and IHC. The patient is returned to the operating room now for re-excision  of the right breast area with special attention to the 12 o'clock margin  where the margin was transected. The patient understands the risks and  potential benefits of surgery and she gives her consent to same.   PROCEDURE:  The patient is positioned supinely. The right breast was prepped  and draped to be included in a sterile operative field following the  induction of satisfactory general anesthesia.  An elliptical incision is  made over the previous incision site which was located in approximately the  5 o'clock to 6 o'clock axis of the right breast. This incision deepened  through the skin down to the subcutaneous tissues. The superior flap was  raised in the 12 o'clock direction up to the infra-areolar space and then  the margins medially and laterally and inferiorly were also taken down.  The  dissection was taken down to the anterior chest wall, excising the entire  previous capsule and the seroma cavity. The sutures were placed at the 12  o'clock and 3 o'clock axis for further identification. The anterior border O  was of the skin ellipse and the posterior border was the pectoralis major  muscle.  The specimen was removed and forwarded for pathologic evaluation.  Hemostasis was then assured with electrocautery. I mobilize breast tissue  superiorly so as to bring some breast tissue down to fill the large dead  space cavity. I then sutured the breast flap down to the inframammary line  with interrupted 2-0 Vicryl sutures. Sponge, instrument and sharp counts  were then verified. Subcutaneous tissues closed with interrupted 3-0 Vicryl  sutures. The skin was closed with running 5-0  Monocryl suture and then reinforced with Steri-Strips.  The sterile dressing  was then applied. The anesthetic reversed and the patient removed from the  operating room to the recovery room in stable condition. She tolerated the  procedure well.  Leonie Man, M.D.  Electronically Signed     PB/MEDQ  D:  07/18/2005  T:  07/18/2005  Job:  [id number]   cc:   Dr. Tommie Ard, M.D.  Fax: [phone]

## 2010-10-06 NOTE — Cardiovascular Report (Signed)
NAME:  Sara Reilly, Sara Reilly                          ACCOUNT NO.:  [id number]   MEDICAL RECORD NO.:  [id number]                   PATIENT TYPE:  OIB   LOCATION:  2899                                 FACILITY:  [facility]   PHYSICIAN:  Eduardo Osier. Sharyn Lull, M.D.              DATE OF BIRTH:  1929-06-09   DATE OF PROCEDURE:  08/10/2003  DATE OF DISCHARGE:                              CARDIAC CATHETERIZATION   PROCEDURE:  Left cardiac catheterization with selective left and right  coronary angiography, left ventriculography, via right groin using Judkins  technique.   INDICATION FOR PROCEDURE:  Sara Reilly is a 77 year old black female with  past medical history significant for insulin-requiring diabetes mellitus,  irritable bowel syndrome, degenerative joint disease, complaints of  retrosternal chest pain described as heaviness with minimal exertion  especially while taking the garbage can down to the side street, associated  with mild shortness of breath, lasting a few minutes, relieved with rest.  Chest pain is rated 3/10.  Denies any nausea, vomiting, diaphoresis.  Denies  palpitation, lightheadedness or syncope but also complains of feeling weak  and tired with minimal exertion.  Denies any rest or nocturnal angina.  EKG  done in my office showed normal sinus rhythm with nonspecific T-wave  changes.   PAST MEDICAL HISTORY:  As above.   PAST SURGICAL HISTORY:  She had appendectomy in the past, had  cholecystectomy in the past.   ALLERGIES:  No known drug allergies.   MEDICATIONS:  1. Glucophage XR 500 mg three tablets daily.  2. Lantus insulin 15-20 units every night.  3. Dicyclomine 20 mg q.i.d.  4. Vitamin E 400 units daily.  5. Calcium one tablet daily.  6. Calcium with vitamin D one tablet daily.  7. Baby aspirin 81 mg one tablet daily was added recently.  8. Toprol XL 25 mg one tablet daily was added also recently.  9. Nitrostat sublingual p.r.n.   SOCIAL HISTORY:  She is widowed,  has two children.  Smoked three packs per  day for 30+ years, quit in 1990s.  Used to drink brandy socially.  Worked as  Engineer, site, presently retired.   FAMILY HISTORY:  Father died of stroke at the age of 9.  Mother died during  childbirth in her 6s.  Her cousins have also a history of coronary artery  disease.   PHYSICAL EXAMINATION:  GENERAL:  She was alert, awake, oriented x3, in no  acute distress.  VITAL SIGNS:  Blood pressure was 140/84, pulse was 92, regular.  HEENT:  Conjunctivae were pink.  NECK: Supple, no JVD, no bruits.  CHEST:  Lungs were clear to auscultation without rhonchi or rales.  CARDIOVASCULAR:  S1, S2 was normal.  There was no S3 gallop or murmur.  ABDOMEN:  Soft, bowel sounds were present, nontender.  EXTREMITIES:  There was no clubbing, cyanosis, or edema.   IMPRESSION:  1. New-onset angina.  2. Insulin-requiring diabetes mellitus.  3. Morbid obesity.  4. Degenerative joint disease.  5. Irritable bowel syndrome.   Discussed with the patient regarding various options of treatment, i.e.,  noninvasive stress testing versus invasive left catheterization, possible  PTCA and stenting, and its risks, i.e., death, MI, stroke, need for  emergency CABG, risk of restenosis, local vascular complications, etc., and  consented for PCI.   PROCEDURE:  After obtaining the informed consent, the patient was brought to  the catheterization lab and was placed on the fluoroscopy table.  Right  groin was prepped and draped in the usual fashion.  Xylocaine 2% was used  for local anesthesia in the right groin.  With the help of thin-walled  needle, a 6 French arterial sheath was placed.  The sheath was aspirated and  flushed.  Next 6 French left Judkins catheter was advanced over the wire  under fluoroscopic guidance up to the ascending aorta.  The wire was pulled  out.  The catheter was aspirated and connected to the manifold.  The  catheter was further advanced and  engaged into the left coronary ostium.  Multiple views of the left system were taken.  Next the catheter was  disengaged and was pulled out over the wire and was replaced with a 6 French  right Judkins catheter, which was advanced over the wire under fluoroscopic  guidance up to the ascending aorta.  The wire was pulled out.  The catheter  was aspirated and connected to the manifold.  The catheter was further  advanced and engaged into right coronary ostium.  Multiple views of the  right system were taken.  Next the catheter was disengaged and was pulled  out over the wire and was replaced with 6 French pigtail catheter, which was  advanced over the wire under fluoroscopic guidance up to the ascending  aorta.  The wire was pulled out, the catheter was aspirated and connected to  the manifold.  The catheter was further advanced across the aortic valve  into the LV.  LV pressures were recorded.  Next left ventriculography was  done in 30 degree RAO projection.  Post-angiographic pressures were recorded  from LV and then pullback pressures were recorded from the aorta.  There was  no gradient across the aortic valve.  Next the pigtail catheter was pulled  out over the wire.  She sheaths were aspirated and flushed.   FINDINGS:  1. LV showed good LV systolic function, EF of 55-60%.  2. Left main was long, which was patent.  3. LAD was patent but was very tortuous proximally.  Diagonal 1 was small,     which was patent.  4. Left circumflex was small, which was patent.  OM-1 was patent.  5. RCA had 40-50% sequential midstenosis.  The vessel also was small and     very tortuous.   The patient tolerated the procedure well.  There were no complications.   Plan is to maximize antianginal medication, and the patient will be treated  medically.                                               Eduardo Osier. Sharyn Lull, M.D.   MNH/MEDQ  D:  08/10/2003  T:  08/12/2003  Job:  [id number]   cc:   [facility]  Cardiac Catheterization Lab   Minerva Areola L.  August Saucer, M.D.  [address] [address]  [place]  [place] [address]  Fax: [phone]

## 2010-10-06 NOTE — Procedures (Signed)
[facility]  Patient:    Sara Reilly, Sara Reilly Visit Number: [id number] MRN: [id number]          Service Type: END Location: ENDO Attending Physician:  Rich Brave Dictated by:   Florencia Reasons, M.D. Proc. Date: 07/07/01 Admit Date:  07/07/2001   CC:         Pershing Cox, M.D.  Eric L. August Saucer, M.D.  Timothy E. Earlene Plater, M.D.   Procedure Report  PROCEDURE:  Colonoscopy.  INDICATIONS FOR PROCEDURE:  A 77 year old female with history of two prior colonoscopies having been performed, most recently 5 years ago, for Hemoccult positive stool.  At this time, the patient is having intermittent small volume hematochezia, and also was Hemoccult positive for me in the office, but is on a daily baby aspirin which may be contributing to that.  FINDINGS:  Normal, except for sigmoid diverticulosis.  CONSENT:  The nature, purpose, and risks of the procedure were familiar to the patient from prior examination, and she provided written consent.  SEDATION: 1. Phentanyl 60 mcg IV without arrhythmias or desaturations. 2. Versed 6 mg IV without arrhythmias or desaturations.  DESCRIPTION OF PROCEDURE:  The Olympus adult video colonoscope was advanced with slight difficulty to the cecum, using external abdominal compression to reach the base of the cecum.  Pullback was then performed.  The quality of the prep was very good, and it was felt that all areas were well seen.  There was rather extensive left-sided diverticulosis, but no polyps, cancer, colitis, or vascular malformations were observed, and retroflexion in the rectum, as well as reinspection of the rectosigmoid, were unremarkable.  No biopsies were obtained.  No source of heme positive stool was identified.  The patient tolerated the procedure well, and there were no apparent complications.  IMPRESSION:  Normal examination, except for moderate sigmoid diverticulosis. No source of  heme positivity identified.  PLAN: 1. Clinical followup. 2. Consider repeat colonoscopy in 5 years. Dictated by:   Florencia Reasons, M.D. Attending Physician:  Rich Brave DD:  07/07/01 TD:  07/07/01 Job: [id number]

## 2010-10-06 NOTE — Op Note (Signed)
NAME:  Sara Reilly, Sara Reilly NO.:  [id number]   MEDICAL RECORD NO.:  [id number]          PATIENT TYPE:  AMB   LOCATION:  DSC                          FACILITY:  [facility]   PHYSICIAN:  Leonie Man, M.D.   DATE OF BIRTH:  03/31/1930   DATE OF PROCEDURE:  06/06/2005  DATE OF DISCHARGE:                                 OPERATIVE REPORT   PREOPERATIVE DIAGNOSIS:  Carcinoma right breast.   POSTOPERATIVE DIAGNOSES:  Carcinoma right breast.   PROCEDURE:  Right partial mastectomy and sentinel lymph node biopsy with  injection of blue dye.   SURGEON:  Leonie Man, M.D.   ASSISTANT:  Nurse.   ANESTHESIA:  General.   NOTE:  Ms. Hirt is a 77 year old lady who on mammogram is noted to have a  spiculated lesion off the right breast measuring approximately 2 cm. This  was biopsied by ultrasound guided biopsy and shows an invasive lobular  carcinoma. The patient's MRI showed a single lesion only. The patient comes  to the operating room, now, for partial mastectomy and sentinel lymph node  biopsy. After the risks and potential benefits of surgery been fully  discussed, all questions answered, consent was obtained.   DESCRIPTION OF PROCEDURE:  Following the induction of satisfactory general  anesthesia, with the patient positioned supinely, I infiltrated the  periareolar region with 5 mL  of diluted methylene blue and massaged this  into the lymphatics of the breast. Approximately 30 minutes prior to this  she had undergone injection of technetium sulfur colloid into the  periareolar region for sentinel lymph node localization.   The right breast was then prepped and draped to be included in a sterile  operative field; and a transverse incision is made just superior to the  localizing needle deepened through the skin and into the subcutaneous tissue  carrying an inferior flap down to the region of the localizing needle; and  dissecting free from around that; and carrying a  superior flap up medial,  and inferior flaps were raised; and a large wedge of breast tissue was taken  all the way down to the anterior chest wall, and removed in its entirety.  This was sent for specimen mammography which showed the wire to be within  the central portion of the specimen. It was then sent for touch prep which  showed the margins to be clear of any metastatic tumor on its margin.   The breast wound was then packed with gauze, and attention then turned to  the right axilla where, with the use of the NeoProbe, a region of increased  radioactivity was located. A transverse axillary incision was made and  deepened through skin, subcutaneous tissue, carrying the dissection down  towards the area of increased radioactivity. In the right located a solitary  blue lymph node with counts of greater than 1300. This was dissected free,  removed and forwarded for pathologic evaluation. Touch preps of the lymph  node showed no evidence of carcinoma. Further probing, within the axilla,  did not reveal any other evidence of radioactive lymph nodes. All areas of  dissection within the wound were then checked for hemostasis and then noted  to be dry. Sponge and instrument counts in both the axilla and the breast  were verified. The axilla was first closed with interrupted 2-0 Vicryl  sutures, and this was followed by a running 4-0 Monocryl suture to close the  skin. The breast was closed with interrupted 3-0 Vicryl  sutures followed by a running 5-0 Monocryl suture in the skin. The wound was  then reinforced with Steri-Strips. Sterile dressings were applied. The  anesthetic reversed, and the patient removed from the operating room to the  recovery room in stable condition. She tolerated the procedure well.      Leonie Man, M.D.  Electronically Signed     PB/MEDQ  D:  06/06/2005  T:  06/06/2005  Job:  [id number]   cc:   Jeralyn Ruths, M.D.   Eric L. August Saucer, M.D.  Fax:  [phone]

## 2010-10-06 NOTE — Op Note (Signed)
NAME:  Sara Reilly, Sara Reilly                          ACCOUNT NO.:  [id number]   MEDICAL RECORD NO.:  [id number]                   PATIENT TYPE:  AMB   LOCATION:  NESC                                 FACILITY:  [facility]   PHYSICIAN:  Pershing Cox, M.D.            DATE OF BIRTH:  1929-12-06   DATE OF PROCEDURE:  01/31/2004  DATE OF DISCHARGE:                                 OPERATIVE REPORT   PREOPERATIVE DIAGNOSES:  1.  Enlarging right vulvar cyst.  2.  Left vulvar dermatitis.   POSTOPERATIVE DIAGNOSES:  1.  Right labia majora cyst.  2.  Left vulvar dermatitis.   PROCEDURES:  1.  Excision of right labia majora vulvar cyst.  2.  Dermal biopsies of the left labia.   SURGEON:  Pershing Cox, M.D.   ANESTHESIA:  MAC plus 0.25% Marcaine local.   INDICATION FOR PROCEDURE:  Sara Reilly has had a cyst of the right labia,  which has been enlarging.  Initially this was thought to be a simple  sebaceous cyst, but it has been getting larger and larger and the decision  is made to the operating room for excision.  In addition, she has a  persistent vulvar dermatitis involving the labiocrural folds on right and  left, and dermal biopsy to be done in the office was scheduled to be done  with the surgical procedure.   FINDINGS:  A 2 cm cyst located in the subcutaneous space on the right upper  labia majora was present.  At the time of this excision there was a 0.25 cm  sebaceous cyst lying immediately adjacent to this cyst, which was excised  with the first cyst.  In addition, there was a small sebaceous cyst lying  along the medial surface of the right labia majora.  The skin surfaces of  the labiocrural folds on right and left were silver and shiny.  Two biopsies  were taken on the left.   PROCEDURE:  Sara Reilly was brought to the operating room with an IV in  place.  She received 1 g of Ancef in the holding area.  Supine on the OR  table, IV sedation was administered and she was  placed in the [place]  stirrups.  The lower abdomen, upper thighs, perineum, and vagina were  prepped with a solution of Hibiclens.  The patient was then draped for a  sterile vaginal procedure.  The bladder was sterilely emptied with a red  rubber catheter.  The upper enlarging cyst was isolated and 0.25% Marcaine  was injected into the periphery of this cyst.  An elliptical incision was  made and carried down through the skin to the dermis.  Bovie cautery was  used to excise the cyst at its base.  A 0.25% cm sebaceous was located  immediately adjacent to this on the patient's right side of the incision.  This was also excised and submitted with  the specimen.  Subcutaneous tissues  were made hemostatic with cautery.  Vicryl 3-0 was used to close the  subcutaneous spaces and then the subcuticular incision was run with 4-0  Vicryl to close the  incision.  The small sebaceous cyst located in the lower right labia, more  medial, was also infiltrated with 0.25% Marcaine.  This was excised with a  knife and closed with 4-0 Vicryl suture.  A 4 mm dermal punch was used to  biopsy two sites in the left labiocrural fold.  These were controlled with  silver nitrate for hemostasis.                                               Pershing Cox, M.D.    MAJ/MEDQ  D:  01/31/2004  T:  01/31/2004  Job:  [id number]   cc:   Minerva Areola L. August Saucer, M.D.  [address] [address]  [place]  [place] [address]  Fax: [phone]

## 2010-10-26 ENCOUNTER — Other Ambulatory Visit: Payer: Self-pay | Admitting: Oncology

## 2010-10-26 ENCOUNTER — Encounter (HOSPITAL_BASED_OUTPATIENT_CLINIC_OR_DEPARTMENT_OTHER): Payer: Medicare Other | Admitting: Oncology

## 2010-10-26 DIAGNOSIS — D649 Anemia, unspecified: Secondary | ICD-10-CM

## 2010-10-26 DIAGNOSIS — C50919 Malignant neoplasm of unspecified site of unspecified female breast: Secondary | ICD-10-CM

## 2010-10-26 DIAGNOSIS — C50319 Malignant neoplasm of lower-inner quadrant of unspecified female breast: Secondary | ICD-10-CM

## 2010-10-26 LAB — CBC WITH DIFFERENTIAL/PLATELET
BASO%: 0.2 % (ref 0.0–2.0)
Basophils Absolute: 0 10*3/uL (ref 0.0–0.1)
EOS%: 2.6 % (ref 0.0–7.0)
MCH: 32.7 pg (ref 25.1–34.0)
MCHC: 31.7 g/dL (ref 31.5–36.0)
MCV: 102.9 fL — ABNORMAL HIGH (ref 79.5–101.0)
MONO%: 8.5 % (ref 0.0–14.0)
NEUT#: 2.9 10*3/uL (ref 1.5–6.5)
NEUT%: 57.9 % (ref 38.4–76.8)
Platelets: 209 10*3/uL (ref 145–400)
WBC: 5.1 10*3/uL (ref 3.9–10.3)
lymph#: 1.6 10*3/uL (ref 0.9–3.3)

## 2010-10-27 ENCOUNTER — Encounter: Payer: Medicare Other | Admitting: Oncology

## 2010-10-27 LAB — COMPREHENSIVE METABOLIC PANEL
AST: 16 U/L (ref 0–37)
Albumin: 4.1 g/dL (ref 3.5–5.2)
Alkaline Phosphatase: 40 U/L (ref 39–117)
BUN: 21 mg/dL (ref 6–23)
Creatinine, Ser: 0.97 mg/dL (ref 0.50–1.10)
Glucose, Bld: 136 mg/dL — ABNORMAL HIGH (ref 70–99)
Total Bilirubin: 0.2 mg/dL — ABNORMAL LOW (ref 0.3–1.2)

## 2011-02-02 ENCOUNTER — Other Ambulatory Visit: Payer: Self-pay | Admitting: Gastroenterology

## 2011-02-15 LAB — POCT I-STAT, CHEM 8
Calcium, Ion: 1.32
Chloride: 110
HCT: 33 — ABNORMAL LOW
Hemoglobin: 11.2 — ABNORMAL LOW

## 2011-02-15 LAB — POCT CARDIAC MARKERS
Myoglobin, poc: 43.1
Operator id: 290111

## 2011-02-15 LAB — DIFFERENTIAL
Basophils Absolute: 0
Basophils Relative: 0
Monocytes Absolute: 0.3
Neutro Abs: 2.6
Neutrophils Relative %: 61

## 2011-02-15 LAB — CBC
Hemoglobin: 10.5 — ABNORMAL LOW
MCHC: 34.2
RDW: 12.1

## 2011-02-20 LAB — COMPREHENSIVE METABOLIC PANEL
AST: 19
Albumin: 3.6
BUN: 11
CO2: 23
Calcium: 9.1
Creatinine, Ser: 0.89
GFR calc Af Amer: >60
GFR calc non Af Amer: >60

## 2011-02-20 LAB — CBC
HCT: 31.6 — ABNORMAL LOW
MCHC: 33.2
MCV: 103.2 — ABNORMAL HIGH
Platelets: 256

## 2011-02-20 LAB — DIFFERENTIAL
Basophils Absolute: 0
Eosinophils Relative: 3
Lymphocytes Relative: 33
Lymphs Abs: 1.4
Neutro Abs: 2.3

## 2011-02-20 LAB — GLUCOSE, CAPILLARY
Glucose-Capillary: 112 — ABNORMAL HIGH
Glucose-Capillary: 113 — ABNORMAL HIGH
Glucose-Capillary: 121 — ABNORMAL HIGH
Glucose-Capillary: 140 — ABNORMAL HIGH

## 2011-03-22 ENCOUNTER — Encounter: Payer: Self-pay | Admitting: Internal Medicine

## 2011-04-07 ENCOUNTER — Other Ambulatory Visit: Payer: Self-pay | Admitting: Cardiology

## 2011-04-28 ENCOUNTER — Other Ambulatory Visit: Payer: Self-pay | Admitting: Cardiology

## 2011-05-06 ENCOUNTER — Other Ambulatory Visit: Payer: Self-pay | Admitting: Cardiology

## 2011-05-27 ENCOUNTER — Other Ambulatory Visit: Payer: Self-pay | Admitting: Cardiology

## 2011-06-27 ENCOUNTER — Encounter: Payer: Self-pay | Admitting: Oncology

## 2011-11-09 ENCOUNTER — Other Ambulatory Visit: Payer: Self-pay | Admitting: Cardiology

## 2011-11-19 ENCOUNTER — Other Ambulatory Visit: Payer: Self-pay | Admitting: Cardiology

## 2012-01-21 ENCOUNTER — Other Ambulatory Visit: Payer: Self-pay | Admitting: Cardiology

## 2012-01-22 ENCOUNTER — Other Ambulatory Visit: Payer: Self-pay | Admitting: Internal Medicine

## 2012-02-15 ENCOUNTER — Other Ambulatory Visit: Payer: Self-pay | Admitting: Internal Medicine

## 2012-02-15 DIAGNOSIS — Z780 Asymptomatic menopausal state: Secondary | ICD-10-CM

## 2012-02-15 DIAGNOSIS — N644 Mastodynia: Secondary | ICD-10-CM

## 2012-02-15 DIAGNOSIS — M2550 Pain in unspecified joint: Secondary | ICD-10-CM

## 2012-02-20 ENCOUNTER — Inpatient Hospital Stay: Admission: RE | Admit: 2012-02-20 | Payer: Medicare Other | Source: Ambulatory Visit

## 2012-02-25 ENCOUNTER — Other Ambulatory Visit: Payer: Medicare Other

## 2012-09-25 ENCOUNTER — Encounter: Payer: Self-pay | Admitting: Internal Medicine

## 2012-09-26 ENCOUNTER — Encounter: Payer: Self-pay | Admitting: Internal Medicine

## 2013-12-28 ENCOUNTER — Other Ambulatory Visit (HOSPITAL_COMMUNITY): Payer: Self-pay | Admitting: Internal Medicine

## 2013-12-28 ENCOUNTER — Ambulatory Visit (HOSPITAL_COMMUNITY): Payer: Medicare Other

## 2013-12-28 DIAGNOSIS — R252 Cramp and spasm: Secondary | ICD-10-CM

## 2013-12-29 ENCOUNTER — Ambulatory Visit (HOSPITAL_COMMUNITY): Payer: Medicare Other

## 2014-01-08 ENCOUNTER — Ambulatory Visit (HOSPITAL_COMMUNITY)
Admission: RE | Admit: 2014-01-08 | Discharge: 2014-01-08 | Disposition: A | Discharge status: Home / Self Care | Payer: Medicare Other | Source: Ambulatory Visit | Attending: Internal Medicine | Admitting: Internal Medicine

## 2014-01-08 DIAGNOSIS — R0989 Other specified symptoms and signs involving the circulatory and respiratory systems: Secondary | ICD-10-CM

## 2014-01-08 DIAGNOSIS — R252 Cramp and spasm: Secondary | ICD-10-CM | POA: Diagnosis not present

## 2014-01-08 NOTE — Progress Notes (Addendum)
VASCULAR LAB PRELIMINARY  ARTERIAL  ABI completed:    RIGHT    LEFT    PRESSURE WAVEFORM  PRESSURE WAVEFORM  BRACHIAL  Triphasic BRACHIAL 134 Triphasic  DP 127 Biphasic DP 100 Monophasic  AT   AT    PT 131 Triphasic PT 154 Triphasic  PER   PER    GREAT TOE  NA GREAT TOE  NA    RIGHT LEFT  ABI 0.98 1.15   Bilateral ABIs are within normal limits.  01/08/2014 4:27 PM Maudry Mayhew, RVT, RDCS, RDMS

## 2015-01-28 ENCOUNTER — Other Ambulatory Visit: Payer: Self-pay | Admitting: Internal Medicine

## 2015-01-28 DIAGNOSIS — R0989 Other specified symptoms and signs involving the circulatory and respiratory systems: Secondary | ICD-10-CM

## 2015-02-03 ENCOUNTER — Ambulatory Visit
Admission: RE | Admit: 2015-02-03 | Discharge: 2015-02-03 | Disposition: A | Discharge status: Home / Self Care | Payer: Medicare Other | Source: Ambulatory Visit | Attending: Internal Medicine | Admitting: Internal Medicine

## 2015-02-03 DIAGNOSIS — R0989 Other specified symptoms and signs involving the circulatory and respiratory systems: Secondary | ICD-10-CM

## 2015-08-30 ENCOUNTER — Other Ambulatory Visit: Payer: Self-pay | Admitting: Cardiology

## 2015-08-30 DIAGNOSIS — R079 Chest pain, unspecified: Secondary | ICD-10-CM

## 2015-09-05 ENCOUNTER — Encounter (HOSPITAL_COMMUNITY)
Admission: RE | Admit: 2015-09-05 | Discharge: 2015-09-05 | Disposition: A | Discharge status: Home / Self Care | Payer: Medicare Other | Source: Ambulatory Visit | Attending: Cardiology | Admitting: Cardiology

## 2015-09-05 DIAGNOSIS — R079 Chest pain, unspecified: Secondary | ICD-10-CM | POA: Insufficient documentation

## 2015-09-05 MED ORDER — TECHNETIUM TC 99M SESTAMIBI GENERIC - CARDIOLITE
30.0000 | Freq: Once | INTRAVENOUS | Status: AC | PRN
  Administered 2015-09-05: 30 via INTRAVENOUS

## 2015-09-05 MED ORDER — REGADENOSON 0.4 MG/5ML IV SOLN
INTRAVENOUS | Status: AC
  Filled 2015-09-05: qty 5

## 2015-09-05 MED ORDER — TECHNETIUM TC 99M SESTAMIBI GENERIC - CARDIOLITE
10.0000 | Freq: Once | INTRAVENOUS | Status: AC | PRN
  Administered 2015-09-05: 10 via INTRAVENOUS

## 2015-09-05 MED ORDER — REGADENOSON 0.4 MG/5ML IV SOLN
0.4000 mg | Freq: Once | INTRAVENOUS | Status: AC
  Administered 2015-09-05: 0.4 mg via INTRAVENOUS

## 2016-07-13 ENCOUNTER — Encounter (HOSPITAL_COMMUNITY): Payer: Self-pay

## 2016-07-13 ENCOUNTER — Emergency Department (HOSPITAL_COMMUNITY): Payer: Medicare Other

## 2016-07-13 ENCOUNTER — Emergency Department (HOSPITAL_COMMUNITY)
Admission: EM | Admit: 2016-07-13 | Discharge: 2016-07-13 | Disposition: A | Discharge status: Home / Self Care | Payer: Medicare Other | Attending: Emergency Medicine | Admitting: Emergency Medicine

## 2016-07-13 DIAGNOSIS — E119 Type 2 diabetes mellitus without complications: Secondary | ICD-10-CM | POA: Diagnosis not present

## 2016-07-13 DIAGNOSIS — R52 Pain, unspecified: Secondary | ICD-10-CM

## 2016-07-13 DIAGNOSIS — Z87891 Personal history of nicotine dependence: Secondary | ICD-10-CM | POA: Diagnosis not present

## 2016-07-13 DIAGNOSIS — M5442 Lumbago with sciatica, left side: Secondary | ICD-10-CM

## 2016-07-13 DIAGNOSIS — M79605 Pain in left leg: Secondary | ICD-10-CM | POA: Diagnosis present

## 2016-07-13 DIAGNOSIS — Z7984 Long term (current) use of oral hypoglycemic drugs: Secondary | ICD-10-CM | POA: Insufficient documentation

## 2016-07-13 DIAGNOSIS — I10 Essential (primary) hypertension: Secondary | ICD-10-CM | POA: Insufficient documentation

## 2016-07-13 DIAGNOSIS — Z79899 Other long term (current) drug therapy: Secondary | ICD-10-CM | POA: Diagnosis not present

## 2016-07-13 LAB — COMPREHENSIVE METABOLIC PANEL WITH GFR
ALT: 34 U/L (ref 14–54)
AST: 25 U/L (ref 15–41)
Albumin: 3.6 g/dL (ref 3.5–5.0)
Alkaline Phosphatase: 52 U/L (ref 38–126)
Anion gap: 7 (ref 5–15)
BUN: 26 mg/dL — ABNORMAL HIGH (ref 6–20)
CO2: 20 mmol/L — ABNORMAL LOW (ref 22–32)
Calcium: 9.6 mg/dL (ref 8.9–10.3)
Chloride: 114 mmol/L — ABNORMAL HIGH (ref 101–111)
Creatinine, Ser: 1.14 mg/dL — ABNORMAL HIGH (ref 0.44–1.00)
GFR calc Af Amer: 50 mL/min — ABNORMAL LOW
GFR calc non Af Amer: 43 mL/min — ABNORMAL LOW
Glucose, Bld: 174 mg/dL — ABNORMAL HIGH (ref 65–99)
Potassium: 4.2 mmol/L (ref 3.5–5.1)
Sodium: 141 mmol/L (ref 135–145)
Total Bilirubin: 0.6 mg/dL (ref 0.3–1.2)
Total Protein: 7 g/dL (ref 6.5–8.1)

## 2016-07-13 LAB — URINALYSIS, ROUTINE W REFLEX MICROSCOPIC
Bilirubin Urine: NEGATIVE
Glucose, UA: NEGATIVE mg/dL
Hgb urine dipstick: NEGATIVE
Ketones, ur: NEGATIVE mg/dL
Leukocytes, UA: NEGATIVE
Nitrite: NEGATIVE
Protein, ur: NEGATIVE mg/dL
Specific Gravity, Urine: 1.021 (ref 1.005–1.030)
pH: 5 (ref 5.0–8.0)

## 2016-07-13 LAB — CBC WITH DIFFERENTIAL/PLATELET
Basophils Absolute: 0 10*3/uL (ref 0.0–0.1)
Basophils Relative: 0 %
EOS ABS: 0 10*3/uL (ref 0.0–0.7)
EOS PCT: 1 %
HCT: 34.2 % — ABNORMAL LOW (ref 36.0–46.0)
Hemoglobin: 11.1 g/dL — ABNORMAL LOW (ref 12.0–15.0)
LYMPHS ABS: 1.4 10*3/uL (ref 0.7–4.0)
LYMPHS PCT: 29 %
MCH: 32.3 pg (ref 26.0–34.0)
MCHC: 32.5 g/dL (ref 30.0–36.0)
MCV: 99.4 fL (ref 78.0–100.0)
MONOS PCT: 10 %
Monocytes Absolute: 0.5 10*3/uL (ref 0.1–1.0)
NEUTROS ABS: 3 10*3/uL (ref 1.7–7.7)
NEUTROS PCT: 60 %
PLATELETS: 173 10*3/uL (ref 150–400)
RBC: 3.44 MIL/uL — ABNORMAL LOW (ref 3.87–5.11)
RDW: 12.3 % (ref 11.5–15.5)
WBC: 4.9 10*3/uL (ref 4.0–10.5)

## 2016-07-13 LAB — PROTIME-INR
INR: 1.04
Prothrombin Time: 13.6 s (ref 11.4–15.2)

## 2016-07-13 MED ORDER — CYCLOBENZAPRINE HCL 10 MG PO TABS: 10.0000 mg | ORAL_TABLET | Freq: Two times a day (BID) | ORAL | 0 refills | Status: DC | PRN

## 2016-07-13 MED ORDER — HYDROCODONE-ACETAMINOPHEN 5-325 MG PO TABS: 1.0000 | ORAL_TABLET | Freq: Four times a day (QID) | ORAL | 0 refills | Status: DC | PRN

## 2016-07-13 MED ORDER — OXYCODONE-ACETAMINOPHEN 5-325 MG PO TABS
1.0000 | ORAL_TABLET | Freq: Once | ORAL | Status: AC
  Administered 2016-07-13: 1 via ORAL
  Filled 2016-07-13: qty 1

## 2016-07-13 MED ORDER — CYCLOBENZAPRINE HCL 10 MG PO TABS
5.0000 mg | ORAL_TABLET | Freq: Once | ORAL | Status: AC
  Administered 2016-07-13: 5 mg via ORAL
  Filled 2016-07-13: qty 1

## 2016-07-13 NOTE — ED Provider Notes (Signed)
[facility] DEPT Provider Note   CSN: [id number] Arrival date & time: 07/13/16  1058     History   Chief Complaint Chief Complaint  Patient presents with  . Leg Pain    HPI Sara Reilly is a 83 y.o. female     The history is provided by the patient and a friend.  Back Pain   This is a recurrent problem. The current episode started yesterday. The problem occurs constantly. The problem has not changed since onset.The pain is associated with no known injury. The pain is present in the lumbar spine and sacro-iliac joint. The quality of the pain is described as shooting and burning. The pain radiates to the left knee and left thigh. The pain is at a severity of 10/10. The pain is severe. The symptoms are aggravated by bending and certain positions. The pain is the same all the time. Associated symptoms include numbness (chronic bilteral distal foot numbness) and leg pain. Pertinent negatives include no chest pain, no fever, no headaches, no abdominal pain, no abdominal swelling, no bowel incontinence, no perianal numbness, no dysuria, no pelvic pain, no paresthesias, no paresis and no weakness.    Past Medical History:  Diagnosis Date  . Breast CA ([place])   . Diabetes ([place])   . Diverticulosis   . Fecal incontinence   . Hypertension   . IBS (irritable bowel syndrome)     There are no active problems to display for this patient.   Past Surgical History:  Procedure Laterality Date  . APPENDECTOMY    . BREAST LUMPECTOMY    . CHOLECYSTECTOMY    . COLONOSCOPY      OB History    No data available       Home Medications    Prior to Admission medications   Medication Sig Start Date End Date Taking? Authorizing Provider  acetaminophen (TYLENOL) 325 MG tablet Take 650 mg by mouth 2 (two) times daily as needed.    Historical Provider, MD  atorvastatin (LIPITOR) 20 MG tablet Take 20 mg by mouth daily.    Historical Provider, MD  bimatoprost (LUMIGAN) 0.01 % SOLN 1 drop at  bedtime.    Historical Provider, MD  cholecalciferol (VITAMIN D) 1000 UNITS tablet Take 1,000 Units by mouth daily.    Historical Provider, MD  donepezil (ARICEPT) 5 MG tablet Take 5 mg by mouth at bedtime as needed.    Historical Provider, MD  metFORMIN (GLUCOPHAGE) 1000 MG tablet Take 1,000 mg by mouth daily.    Historical Provider, MD  montelukast (SINGULAIR) 10 MG tablet Take 10 mg by mouth at bedtime.    Historical Provider, MD  omeprazole (PRILOSEC) 20 MG capsule Take 20 mg by mouth daily.    Historical Provider, MD  ramipril (ALTACE) 10 MG tablet Take 10 mg by mouth daily.    Historical Provider, MD  traMADol (ULTRAM) 50 MG tablet Take 50 mg by mouth 2 (two) times daily.    Historical Provider, MD  vitamin E 400 UNIT capsule Take 400 Units by mouth daily.    Historical Provider, MD    Family History Family History  Problem Relation Age of Onset  . Stroke Father     Social History Social History  Substance Use Topics  . Smoking status: Former Research scientist (life sciences)  . Smokeless tobacco: Never Used  . Alcohol use No     Allergies   Patient has no known allergies.   Review of Systems Review of Systems  Constitutional: Negative for  activity change, chills, diaphoresis, fatigue and fever.  HENT: Negative for congestion and rhinorrhea.   Eyes: Negative for visual disturbance.  Respiratory: Negative for cough, chest tightness, shortness of breath and stridor.   Cardiovascular: Negative for chest pain, palpitations and leg swelling.  Gastrointestinal: Negative for abdominal distention, abdominal pain, bowel incontinence, constipation, diarrhea, nausea and vomiting.  Genitourinary: Positive for frequency. Negative for difficulty urinating, dysuria, flank pain, hematuria, menstrual problem, pelvic pain, vaginal bleeding and vaginal discharge.  Musculoskeletal: Positive for back pain. Negative for neck pain.  Skin: Negative for rash and wound.  Neurological: Positive for numbness (chronic  bilteral distal foot numbness). Negative for dizziness, weakness, light-headedness, headaches and paresthesias.  Psychiatric/Behavioral: Negative for agitation and confusion.  All other systems reviewed and are negative.    Physical Exam Updated Vital Signs BP 128/57 (BP Location: Right Arm)   Pulse 65   Temp 97.9 F (36.6 C) (Oral)   Resp 18   Ht 5\' 1"  (1.549 m)   Wt 144 lb (65.3 kg)   SpO2 99%   BMI 27.21 kg/m   Physical Exam  Constitutional: She is oriented to person, place, and time. She appears well-developed and well-nourished. No distress.  HENT:  Head: Normocephalic and atraumatic.  Right Ear: External ear normal.  Left Ear: External ear normal.  Nose: Nose normal.  Mouth/Throat: Oropharynx is clear and moist. No oropharyngeal exudate.  Eyes: Conjunctivae and EOM are normal. Pupils are equal, round, and reactive to light.  Neck: Normal range of motion. Neck supple.  Cardiovascular: Normal rate and intact distal pulses.   No murmur heard. Pulmonary/Chest: Effort normal. No stridor. No respiratory distress. She has no wheezes. She exhibits no tenderness.  Abdominal: She exhibits no distension. There is no tenderness. There is no rebound.  Musculoskeletal: She exhibits tenderness.       Left hip: She exhibits tenderness.       Lumbar back: She exhibits tenderness and pain.       Back:       Legs: Neurological: She is alert and oriented to person, place, and time. She has normal reflexes. She displays normal reflexes. No cranial nerve deficit or sensory deficit. She exhibits normal muscle tone. Coordination normal.  Skin: Skin is warm. Capillary refill takes less than 2 seconds. No rash noted. She is not diaphoretic. No erythema. No pallor.  Psychiatric: She has a normal mood and affect.  Nursing note and vitals reviewed.    ED Treatments / Results  Labs (all labs ordered are listed, but only abnormal results are displayed) Labs Reviewed  URINALYSIS, ROUTINE W  REFLEX MICROSCOPIC - Abnormal; Notable for the following:       Result Value   APPearance HAZY (*)    All other components within normal limits  CBC WITH DIFFERENTIAL/PLATELET - Abnormal; Notable for the following:    RBC 3.44 (*)    Hemoglobin 11.1 (*)    HCT 34.2 (*)    All other components within normal limits  COMPREHENSIVE METABOLIC PANEL - Abnormal; Notable for the following:    Chloride 114 (*)    CO2 20 (*)    Glucose, Bld 174 (*)    BUN 26 (*)    Creatinine, Ser 1.14 (*)    GFR calc non Af Amer 43 (*)    GFR calc Af Amer 50 (*)    All other components within normal limits  PROTIME-INR    EKG  EKG Interpretation None       Radiology  Dg Pelvis 1-2 Views  Result Date: 07/13/2016 CLINICAL DATA:  Left leg pain and numbness. EXAM: PELVIS - 1-2 VIEW COMPARISON:  CT scan of the abdomen and pelvis dated 03/27/2010 FINDINGS: There is no evidence of pelvic fracture or diastasis. No pelvic bone lesions are seen. Calcification in the distal aorta and iliac vessels. IMPRESSION: No significant bone abnormality.  Aortic atherosclerosis. Electronically Signed   By: Lorriane Shire M.D.   On: 07/13/2016 12:21   Ct Lumbar Spine Wo Contrast  Result Date: 07/13/2016 CLINICAL DATA:  Acute onset of low back pain and bilateral leg pain left worse than right. EXAM: CT LUMBAR SPINE WITHOUT CONTRAST TECHNIQUE: Multidetector CT imaging of the lumbar spine was performed without intravenous contrast administration. Multiplanar CT image reconstructions were also generated. COMPARISON:  03/27/2010 FINDINGS: Segmentation: 5 lumbar type vertebral bodies. Alignment: Thoracolumbar curvature convex to the right and lower lumbar curvature convex to the left. Anterolisthesis at L4-5 of 7 mm, increased slightly since 2011. Vertebrae: No fracture or primary bone lesion. Chronic discogenic endplate changes. Paraspinal and other soft tissues: Aortic atherosclerosis. Disc levels: T12-L1: Disc degeneration with vacuum  phenomenon. Mild bulging of the disc. No stenosis. L1-2: Mild bulging of the disc. Mild facet and ligamentous hypertrophy. No stenosis. L2-3: Disc degeneration of vacuum phenomenon. Circumferential disc protrusion with annular calcification. Mild facet and ligamentous hypertrophy. Stenosis of both lateral recesses and neural foramina that could cause neural compression. Worse on the left. Similar in 2011. L3-4: Disc degeneration with circumferential annular bulging and annular calcification. Facet and ligamentous hypertrophy. Stenosis of both lateral recesses and both neural foramina that could cause neural compression. Worse on the left. Similar in 2011. L4-5: Advanced bilateral facet arthropathy with 7 mm of anterolisthesis. Disc degeneration with circumferential protrusion. Very severe multifactorial stenosis at this level. Very severe foraminal stenosis bilaterally. L5-S1: Bulging of the disc with focal herniation in the left foraminal to extraforaminal region. Facet and ligamentous hypertrophy. No central canal stenosis. Certain compression of the left L5 nerve root. This was not seen in 2011. IMPRESSION: L2-3 and L3-4: Circumferential annular protrusion calcification. Facet and ligamentous hypertrophy. Stenosis of both lateral recesses and neural foramina, left more than right. Neural compression could densely occur at these levels. The appearance was quite similar on a CT abdomen of 2011. L4-5: Advanced bilateral facet arthropathy with 7 mm of anterolisthesis. Circumferential annular protrusion and calcification. Very severe stenosis of the canal and both neural foramina. This has worsened slightly since 2011. L5-S1: Left foraminal disc herniation quite likely to compress the left L5 nerve root. This was not seen in 2011. Electronically Signed   By: Nelson Chimes M.D.   On: 07/13/2016 13:01   Dg Femur Min 2 Views Left  Result Date: 07/13/2016 CLINICAL DATA:  Left leg pain and numbness. EXAM: LEFT FEMUR 2  VIEWS COMPARISON:  None. FINDINGS: There is no fracture or dislocation or bone destruction. Moderately severe osteoarthritis of the knee joint, most prominent in the lateral compartment. Chondrocalcinosis. Arterial calcifications in the mid thigh, probably in the superficial femoral artery. IMPRESSION: No acute bone abnormality. Osteoarthritis of the left knee. Atherosclerosis. Electronically Signed   By: Lorriane Shire M.D.   On: 07/13/2016 12:23    Procedures Procedures (including critical care time)  Medications Ordered in ED Medications  oxyCODONE-acetaminophen (PERCOCET/ROXICET) 5-325 MG per tablet 1 tablet (1 tablet Oral Given 07/13/16 1154)  cyclobenzaprine (FLEXERIL) tablet 5 mg (5 mg Oral Given 07/13/16 1154)     Initial Impression / Assessment and  Plan / ED Course  I have reviewed the triage vital signs and the nursing notes.  Pertinent labs & imaging results that were available during my care of the patient were reviewed by me and considered in my medical decision making (see chart for details).     GENNAVIEVE LAPOINT is a 83 y.o. female With a past medical history significant for hypertension, diabetes, and prior breast cancer who presents with low back pain radiating down her left leg. She reports having similar pain in  The past but reports that her pain returned yesterday. She denies recent traumas. She described the pain in her low back, left side, and radiates down the back of her left leg to the knee. She reports chronic numbness in her bilateral lower extremities in the feet. No history of DVT or PE. No lower extremity edema, swelling, erythema, or other problems. No other systemic complaints aside from mild frequency of urination. No loss of bowel or bladder control.  Exam revealed tenderness in the lower left back. Straight leg raise positive for sciatic pain. Numbness in feet is symmetric and unchanged by report. Exam otherwise unremarkable.  Suspect sciatic pain however,  given history of cancer And age, patient will have CT to look for Fractures, malignancy, or other problems causing her pain. Patient given pain medicine and muscle relaxant.  Imaging revealed stenosis likely causing symptoms at the L5/S1 location. Overall, imaging appears similar to prior Aside from possible nerve root compression. No evidence of UTI and x-rays otherwise unremarkable.  As patient felt better after medications, and with no red flags for cauda equina, do not feel patient needs MRI in the ED. Patient will be referred to spine team and PCP for further management.  Patient given prescriptions for medications and patient was discharged in good condition with strict return precautions.   Final Clinical Impressions(s) / ED Diagnoses   Final diagnoses:  Left leg pain  Acute left-sided low back pain with left-sided sciatica    New Prescriptions Discharge Medication List as of 07/13/2016  4:35 PM    START taking these medications   Details  cyclobenzaprine (FLEXERIL) 10 MG tablet Take 1 tablet (10 mg total) by mouth 2 (two) times daily as needed for muscle spasms., Starting Fri 07/13/2016, Print    HYDROcodone-acetaminophen (NORCO/VICODIN) 5-325 MG tablet Take 1 tablet by mouth every 6 (six) hours as needed., Starting Fri 07/13/2016, Print        Clinical Impression: 1. Acute left-sided low back pain with left-sided sciatica   2. Left leg pain   3. Pain     Disposition: Discharge  Condition: Good  I have discussed the results, Dx and Tx plan with the pt(& family if present). He/she/they expressed understanding and agree(s) with the plan. Discharge instructions discussed at great length. Strict return precautions discussed and pt &/or family have verbalized understanding of the instructions. No further questions at time of discharge.    Discharge Medication List as of 07/13/2016  4:35 PM    START taking these medications   Details  cyclobenzaprine (FLEXERIL) 10 MG tablet  Take 1 tablet (10 mg total) by mouth 2 (two) times daily as needed for muscle spasms., Starting Fri 07/13/2016, Print    HYDROcodone-acetaminophen (NORCO/VICODIN) 5-325 MG tablet Take 1 tablet by mouth every 6 (six) hours as needed., Starting Fri 07/13/2016, Print        Follow Up: Rogers Blocker, MD [address] [place] [address] [phone]     [facility]  [facility] [address] [id number] [place] [phone] Schedule an appointment as soon as possible for a visit    Tamala Fothergill, MD [address] [place] [address] [phone]        Courtney Paris, MD 07/13/16 2049

## 2016-07-13 NOTE — Discharge Instructions (Signed)
Please take the pain medicine and muscle relaxant as needed for symptoms. Please stay hydrated. Please follow-up with your primary care physician as well as a spine doctor has seen above for further management of your low back pain. If symptoms worsen or you develop new symptoms, please return to the nearest emergency department.

## 2016-07-13 NOTE — ED Triage Notes (Signed)
Per Pt, Pt reports having left leg pain that started yesterday with some numbness that starts at her knee and goes to her left foot. Pt is able to ambulate with walked. Denies injury.

## 2016-07-13 NOTE — ED Notes (Addendum)
Pt at scans

## 2016-07-13 NOTE — ED Notes (Signed)
MD at bedside updating pt

## 2016-10-30 ENCOUNTER — Encounter (HOSPITAL_COMMUNITY): Payer: Self-pay | Admitting: Emergency Medicine

## 2016-10-30 ENCOUNTER — Ambulatory Visit (HOSPITAL_COMMUNITY)
Admission: EM | Admit: 2016-10-30 | Discharge: 2016-10-30 | Disposition: A | Discharge status: Home / Self Care | Payer: Medicare Other | Attending: Internal Medicine | Admitting: Internal Medicine

## 2016-10-30 DIAGNOSIS — K589 Irritable bowel syndrome without diarrhea: Secondary | ICD-10-CM | POA: Diagnosis not present

## 2016-10-30 DIAGNOSIS — I10 Essential (primary) hypertension: Secondary | ICD-10-CM | POA: Insufficient documentation

## 2016-10-30 DIAGNOSIS — Z7984 Long term (current) use of oral hypoglycemic drugs: Secondary | ICD-10-CM | POA: Diagnosis not present

## 2016-10-30 DIAGNOSIS — K5790 Diverticulosis of intestine, part unspecified, without perforation or abscess without bleeding: Secondary | ICD-10-CM | POA: Insufficient documentation

## 2016-10-30 DIAGNOSIS — Z853 Personal history of malignant neoplasm of breast: Secondary | ICD-10-CM | POA: Insufficient documentation

## 2016-10-30 DIAGNOSIS — E119 Type 2 diabetes mellitus without complications: Secondary | ICD-10-CM | POA: Diagnosis not present

## 2016-10-30 DIAGNOSIS — Z87891 Personal history of nicotine dependence: Secondary | ICD-10-CM | POA: Insufficient documentation

## 2016-10-30 DIAGNOSIS — N3090 Cystitis, unspecified without hematuria: Secondary | ICD-10-CM | POA: Insufficient documentation

## 2016-10-30 DIAGNOSIS — R300 Dysuria: Secondary | ICD-10-CM | POA: Diagnosis present

## 2016-10-30 LAB — POCT URINALYSIS DIP (DEVICE)
BILIRUBIN URINE: NEGATIVE
GLUCOSE, UA: NEGATIVE mg/dL
KETONES UR: NEGATIVE mg/dL
Nitrite: NEGATIVE
Protein, ur: 100 mg/dL — AB
SPECIFIC GRAVITY, URINE: 1.025 (ref 1.005–1.030)
Urobilinogen, UA: 0.2 mg/dL (ref 0.0–1.0)
pH: 5.5 (ref 5.0–8.0)

## 2016-10-30 MED ORDER — CEPHALEXIN 500 MG PO CAPS: 500.0000 mg | ORAL_CAPSULE | Freq: Four times a day (QID) | ORAL | 0 refills | Status: DC

## 2016-10-30 MED ORDER — PHENAZOPYRIDINE HCL 200 MG PO TABS: 200.0000 mg | ORAL_TABLET | Freq: Three times a day (TID) | ORAL | 0 refills | Status: DC | PRN

## 2016-10-30 NOTE — Discharge Instructions (Signed)
You are being treated today for a urinary tract infection. I have prescribed Keflex, take 1 tablet 4 times a day for 5 days. I have also prescribed Pyridium. Take 1 tablet a day 3 times a day for 2 days. Your urine will be sent for culture and you will be notified should any change in therapy be needed. Drink plenty of fluids and rest. Should your symptoms fail to resolve, follow up with your primary care provider or return to clinic.  °

## 2016-10-30 NOTE — ED Provider Notes (Signed)
CSN: [id number]     Arrival date & time 10/30/16  1950 History   None    Chief Complaint  Patient presents with  . Dysuria   (Consider location/radiation/quality/duration/timing/severity/associated sxs/prior Treatment) The history is provided by the patient.  Dysuria  Pain quality:  Aching, burning and sharp Pain severity:  Moderate Onset quality:  Gradual Duration:  1 month Timing:  Constant Progression:  Worsening Chronicity:  New Recent urinary tract infections: yes   Relieved by:  Nothing Worsened by:  Nothing Ineffective treatments:  None tried Urinary symptoms: discolored urine and frequent urination   Urinary symptoms: no hematuria and no bladder incontinence   Associated symptoms: no abdominal pain, no fever, no flank pain, no nausea and no vomiting   Risk factors: no recurrent urinary tract infections, no single kidney and no urinary catheter     Past Medical History:  Diagnosis Date  . Breast CA ([place])   . Diabetes ([place])   . Diverticulosis   . Fecal incontinence   . Hypertension   . IBS (irritable bowel syndrome)    Past Surgical History:  Procedure Laterality Date  . APPENDECTOMY    . BREAST LUMPECTOMY    . CHOLECYSTECTOMY    . COLONOSCOPY     Family History  Problem Relation Age of Onset  . Stroke Father    Social History  Substance Use Topics  . Smoking status: Former Research scientist (life sciences)  . Smokeless tobacco: Never Used  . Alcohol use No   OB History    No data available     Review of Systems  Constitutional: Negative for appetite change, chills and fever.  HENT: Negative.   Respiratory: Negative.   Cardiovascular: Negative.   Gastrointestinal: Negative for abdominal pain, nausea and vomiting.  Genitourinary: Positive for dysuria, frequency and urgency. Negative for flank pain.  Musculoskeletal: Negative.   Skin: Negative.   Neurological: Negative.     Allergies  Patient has no known allergies.  Home Medications   Prior to Admission medications    Medication Sig Start Date End Date Taking? Authorizing Provider  acetaminophen (TYLENOL) 325 MG tablet Take 650 mg by mouth 2 (two) times daily as needed.    [provider]  atorvastatin (LIPITOR) 20 MG tablet Take 20 mg by mouth daily.    [provider]  bimatoprost (LUMIGAN) 0.01 % SOLN 1 drop at bedtime.    [provider]  cephALEXin (KEFLEX) 500 MG capsule Take 1 capsule (500 mg total) by mouth 4 (four) times daily. 10/30/16   Barnet Glasgow, NP  cholecalciferol (VITAMIN D) 1000 UNITS tablet Take 1,000 Units by mouth daily.    [provider]  cyclobenzaprine (FLEXERIL) 10 MG tablet Take 1 tablet (10 mg total) by mouth 2 (two) times daily as needed for muscle spasms. 07/13/16   Tegeler, Gwenyth Allegra, MD  donepezil (ARICEPT) 5 MG tablet Take 5 mg by mouth at bedtime as needed.    [provider]  HYDROcodone-acetaminophen (NORCO/VICODIN) 5-325 MG tablet Take 1 tablet by mouth every 6 (six) hours as needed. 07/13/16   Tegeler, Gwenyth Allegra, MD  metFORMIN (GLUCOPHAGE) 1000 MG tablet Take 1,000 mg by mouth daily.    [provider]  montelukast (SINGULAIR) 10 MG tablet Take 10 mg by mouth at bedtime.    [provider]  omeprazole (PRILOSEC) 20 MG capsule Take 20 mg by mouth daily.    [provider]  phenazopyridine (PYRIDIUM) 200 MG tablet Take 1 tablet (200 mg total) by mouth  3 (three) times daily as needed for pain. 10/30/16   Barnet Glasgow, NP  ramipril (ALTACE) 10 MG tablet Take 10 mg by mouth daily.    [provider]  traMADol (ULTRAM) 50 MG tablet Take 50 mg by mouth 2 (two) times daily.    [provider]  vitamin E 400 UNIT capsule Take 400 Units by mouth daily.    [provider]   Meds Ordered and Administered this Visit  Medications - No data to display  BP (!) 147/55 (BP Location: Right Arm)   Pulse 63   Temp 97.8 F (36.6 C) (Oral)   Resp 18   SpO2 100%  No data  found.   Physical Exam  Constitutional: She is oriented to person, place, and time. She appears well-developed and well-nourished. No distress.  HENT:  Head: Normocephalic.  Right Ear: External ear normal.  Left Ear: External ear normal.  Eyes: Conjunctivae are normal.  Neck: Normal range of motion.  Abdominal: Soft. Bowel sounds are normal. She exhibits no distension and no mass. There is no tenderness. There is no guarding and no CVA tenderness.  Neurological: She is alert and oriented to person, place, and time.  Skin: Skin is warm and dry. Capillary refill takes less than 2 seconds. She is not diaphoretic.  Psychiatric: She has a normal mood and affect. Her behavior is normal.  Nursing note and vitals reviewed.   Urgent Care Course     Procedures (including critical care time)  Labs Review Labs Reviewed  POCT URINALYSIS DIP (DEVICE) - Abnormal; Notable for the following:       Result Value   Hgb urine dipstick MODERATE (*)    Protein, ur 100 (*)    Leukocytes, UA SMALL (*)    All other components within normal limits  URINE CULTURE    Imaging Review No results found.      MDM   1. Cystitis    Urine sent for culture, started on Keflex, Pyridium, follow up with urology if symptoms persist.    Barnet Glasgow, NP 10/30/16 2002

## 2016-10-30 NOTE — ED Triage Notes (Signed)
The patient presented to the [facility] with a complaint of dysuria x 1 month that has gotten worse recently.

## 2016-11-01 LAB — URINE CULTURE

## 2017-04-24 ENCOUNTER — Ambulatory Visit (HOSPITAL_COMMUNITY)
Admission: EM | Admit: 2017-04-24 | Discharge: 2017-04-24 | Disposition: A | Discharge status: Home / Self Care | Payer: Medicare Other | Attending: Family Medicine | Admitting: Family Medicine

## 2017-04-24 ENCOUNTER — Encounter (HOSPITAL_COMMUNITY): Payer: Self-pay | Admitting: Emergency Medicine

## 2017-04-24 DIAGNOSIS — Z79899 Other long term (current) drug therapy: Secondary | ICD-10-CM | POA: Diagnosis not present

## 2017-04-24 DIAGNOSIS — I10 Essential (primary) hypertension: Secondary | ICD-10-CM | POA: Diagnosis not present

## 2017-04-24 DIAGNOSIS — Z7984 Long term (current) use of oral hypoglycemic drugs: Secondary | ICD-10-CM | POA: Insufficient documentation

## 2017-04-24 DIAGNOSIS — E119 Type 2 diabetes mellitus without complications: Secondary | ICD-10-CM | POA: Insufficient documentation

## 2017-04-24 DIAGNOSIS — N390 Urinary tract infection, site not specified: Secondary | ICD-10-CM | POA: Diagnosis present

## 2017-04-24 DIAGNOSIS — K589 Irritable bowel syndrome without diarrhea: Secondary | ICD-10-CM | POA: Insufficient documentation

## 2017-04-24 DIAGNOSIS — Z87891 Personal history of nicotine dependence: Secondary | ICD-10-CM | POA: Insufficient documentation

## 2017-04-24 DIAGNOSIS — Z853 Personal history of malignant neoplasm of breast: Secondary | ICD-10-CM | POA: Diagnosis not present

## 2017-04-24 LAB — POCT URINALYSIS DIP (DEVICE)
GLUCOSE, UA: NEGATIVE mg/dL
KETONES UR: 15 mg/dL — AB
Nitrite: POSITIVE — AB
Protein, ur: >=300 mg/dL — AB
SPECIFIC GRAVITY, URINE: 1.015 (ref 1.005–1.030)
Urobilinogen, UA: >=8 mg/dL (ref 0.0–1.0)
pH: 5 (ref 5.0–8.0)

## 2017-04-24 MED ORDER — PHENAZOPYRIDINE HCL 200 MG PO TABS
200.0000 mg | ORAL_TABLET | Freq: Three times a day (TID) | ORAL | 0 refills | Status: DC
Start: 2017-04-24 — End: 2017-04-24

## 2017-04-24 MED ORDER — CEPHALEXIN 500 MG PO CAPS
500.0000 mg | ORAL_CAPSULE | Freq: Four times a day (QID) | ORAL | 0 refills | Status: AC
Start: 2017-04-24 — End: 2017-05-01

## 2017-04-24 MED ORDER — PHENAZOPYRIDINE HCL 200 MG PO TABS: 200.0000 mg | ORAL_TABLET | Freq: Three times a day (TID) | ORAL | 0 refills | Status: DC

## 2017-04-24 MED ORDER — CEPHALEXIN 500 MG PO CAPS: 500.0000 mg | ORAL_CAPSULE | Freq: Four times a day (QID) | ORAL | 0 refills | Status: DC

## 2017-04-24 NOTE — Discharge Instructions (Signed)
Complete course of antibiotics.  Drink plenty of water to empty bladder regularly.  You may take Pyridium three times a day as needed for bladder pain. Please follow up with your primary care provider next week for a recheck. If your symptoms worsen, develop fevers, weakness, increased pain, blood in urine, lethargy or otherwise worsening please go to Er.

## 2017-04-24 NOTE — ED Provider Notes (Signed)
[place]    CSN: [id number] Arrival date & time: 04/24/17  1555     History   Chief Complaint Chief Complaint  Patient presents with  . Urinary Tract Infection    HPI Sara Reilly is a 83 y.o. female.   [place] presents with complaints of pain with urination which she feels may have been going on for a few weeks but has worsened over the past week. She tried taking uribel as well as Azo which have provided temporarily relief. She states feels similar to previous UTI's she has had in the past. She has seen urology in the past. Without fevers, abdominal pain , urinary frequency, increased pain back from baseline. States she has had low BP at her PCP's office. Without dizziness or weakness. She is eating and drinking.    ROS per HPI.       Past Medical History:  Diagnosis Date  . Breast CA ([place])   . Diabetes ([place])   . Diverticulosis   . Fecal incontinence   . Hypertension   . IBS (irritable bowel syndrome)     There are no active problems to display for this patient.   Past Surgical History:  Procedure Laterality Date  . APPENDECTOMY    . BREAST LUMPECTOMY    . CHOLECYSTECTOMY    . COLONOSCOPY      OB History    No data available       Home Medications    Prior to Admission medications   Medication Sig Start Date End Date Taking? Authorizing Provider  acetaminophen (TYLENOL) 325 MG tablet Take 650 mg by mouth 2 (two) times daily as needed.    [provider]  atorvastatin (LIPITOR) 20 MG tablet Take 20 mg by mouth daily.    [provider]  bimatoprost (LUMIGAN) 0.01 % SOLN 1 drop at bedtime.    [provider]  cephALEXin (KEFLEX) 500 MG capsule Take 1 capsule (500 mg total) by mouth 4 (four) times daily for 7 days. 04/24/17 05/01/17  Zigmund Gottron, NP  cholecalciferol (VITAMIN D) 1000 UNITS tablet Take 1,000 Units by mouth daily.    [provider]  cyclobenzaprine (FLEXERIL) 10 MG tablet Take 1 tablet  (10 mg total) by mouth 2 (two) times daily as needed for muscle spasms. 07/13/16   Tegeler, Gwenyth Allegra, MD  donepezil (ARICEPT) 5 MG tablet Take 5 mg by mouth at bedtime as needed.    [provider]  HYDROcodone-acetaminophen (NORCO/VICODIN) 5-325 MG tablet Take 1 tablet by mouth every 6 (six) hours as needed. 07/13/16   Tegeler, Gwenyth Allegra, MD  metFORMIN (GLUCOPHAGE) 1000 MG tablet Take 1,000 mg by mouth daily.    [provider]  montelukast (SINGULAIR) 10 MG tablet Take 10 mg by mouth at bedtime.    [provider]  omeprazole (PRILOSEC) 20 MG capsule Take 20 mg by mouth daily.    [provider]  phenazopyridine (PYRIDIUM) 200 MG tablet Take 1 tablet (200 mg total) by mouth 3 (three) times daily. 04/24/17   Augusto Gamble B, NP  ramipril (ALTACE) 10 MG tablet Take 10 mg by mouth daily.    [provider]  traMADol (ULTRAM) 50 MG tablet Take 50 mg by mouth 2 (two) times daily.    [provider]  vitamin E 400 UNIT capsule Take 400 Units by mouth daily.    [provider]    Family History Family History  Problem Relation Age of Onset  .  Stroke Father     Social History Social History   Tobacco Use  . Smoking status: Former Research scientist (life sciences)  . Smokeless tobacco: Never Used  Substance Use Topics  . Alcohol use: No  . Drug use: No     Allergies   Patient has no known allergies.   Review of Systems Review of Systems   Physical Exam Triage Vital Signs ED Triage Vitals [04/24/17 1618]  Enc Vitals Group     BP (!) 90/28     Pulse Rate 67     Resp 18     Temp 97.9 F (36.6 C)     Temp Source Oral     SpO2 99 %     Weight      Height      Head Circumference      Peak Flow      Pain Score      Pain Loc      Pain Edu?      Excl. in [place]?    No data found.  Updated Vital Signs BP (!) 103/42 (BP Location: Right Arm)   Pulse 67   Temp 97.9 F (36.6 C) (Oral)   Resp 18   SpO2 99%   Visual Acuity Right Eye  Distance:   Left Eye Distance:   Bilateral Distance:    Right Eye Near:   Left Eye Near:    Bilateral Near:     Physical Exam  Constitutional: She is oriented to person, place, and time. She appears well-developed and well-nourished. No distress.  Cardiovascular: Normal rate, regular rhythm and normal heart sounds.  Pulmonary/Chest: Effort normal and breath sounds normal.  Abdominal: She exhibits no distension. There is no tenderness. There is no rigidity, no rebound, no guarding and no CVA tenderness.  Neurological: She is alert and oriented to person, place, and time.  Skin: Skin is warm and dry.     UC Treatments / Results  Labs (all labs ordered are listed, but only abnormal results are displayed) Labs Reviewed  POCT URINALYSIS DIP (DEVICE) - Abnormal; Notable for the following components:      Result Value   Bilirubin Urine MODERATE (*)    Ketones, ur 15 (*)    Hgb urine dipstick SMALL (*)    Protein, ur >=300 (*)    Nitrite POSITIVE (*)    Leukocytes, UA LARGE (*)    All other components within normal limits  URINE CULTURE    EKG  EKG Interpretation None       Radiology No results found.  Procedures Procedures (including critical care time)  Medications Ordered in UC Medications - No data to display   Initial Impression / Assessment and Plan / UC Course  I have reviewed the triage vital signs and the nursing notes.  Pertinent labs & imaging results that were available during my care of the patient were reviewed by me and considered in my medical decision making (see chart for details).     Patient non toxic in appearance, alert, oriented, ambulatory. Symptoms and UA consistent with uti. Culture sent to ensure adequate coverage. Pyridium as needed. Push fluids, recheck with pcp in the next week. Return precautions provided.   Final Clinical Impressions(s) / UC Diagnoses   Final diagnoses:  Urinary tract infection without hematuria, site unspecified     ED Discharge Orders        Ordered    cephALEXin (KEFLEX) 500 MG capsule  4 times daily     04/24/17  1719    phenazopyridine (PYRIDIUM) 200 MG tablet  3 times daily     04/24/17 1719       Controlled Substance Prescriptions [place] Controlled Substance Registry consulted? Not Applicable   Zigmund Gottron, NP 04/24/17 1724

## 2017-04-24 NOTE — ED Triage Notes (Signed)
Pt sts UTI sx with dysuria

## 2017-04-26 LAB — URINE CULTURE

## 2017-10-28 ENCOUNTER — Other Ambulatory Visit: Payer: Self-pay | Admitting: Physical Medicine and Rehabilitation

## 2017-10-28 DIAGNOSIS — M545 Low back pain: Secondary | ICD-10-CM

## 2017-10-30 ENCOUNTER — Ambulatory Visit
Admission: RE | Admit: 2017-10-30 | Discharge: 2017-10-30 | Disposition: A | Discharge status: Home / Self Care | Payer: Medicare Other | Source: Ambulatory Visit | Attending: Physical Medicine and Rehabilitation | Admitting: Physical Medicine and Rehabilitation

## 2017-10-30 DIAGNOSIS — M545 Low back pain: Secondary | ICD-10-CM

## 2018-04-21 ENCOUNTER — Other Ambulatory Visit: Payer: Self-pay | Admitting: Internal Medicine

## 2018-04-21 DIAGNOSIS — I739 Peripheral vascular disease, unspecified: Secondary | ICD-10-CM

## 2018-04-23 ENCOUNTER — Other Ambulatory Visit: Payer: Medicare Other

## 2018-04-23 ENCOUNTER — Ambulatory Visit
Admission: RE | Admit: 2018-04-23 | Discharge: 2018-04-23 | Disposition: A | Discharge status: Home / Self Care | Payer: Medicare Other | Source: Ambulatory Visit | Attending: Internal Medicine | Admitting: Internal Medicine

## 2018-04-23 DIAGNOSIS — I739 Peripheral vascular disease, unspecified: Secondary | ICD-10-CM

## 2019-07-25 ENCOUNTER — Ambulatory Visit: Payer: 59

## 2020-02-23 ENCOUNTER — Other Ambulatory Visit: Payer: Self-pay | Admitting: Internal Medicine

## 2020-02-23 ENCOUNTER — Ambulatory Visit
Admission: RE | Admit: 2020-02-23 | Discharge: 2020-02-23 | Disposition: A | Discharge status: Home / Self Care | Payer: 59 | Source: Ambulatory Visit | Attending: Internal Medicine | Admitting: Internal Medicine

## 2020-02-23 DIAGNOSIS — R0609 Other forms of dyspnea: Secondary | ICD-10-CM

## 2020-05-19 ENCOUNTER — Other Ambulatory Visit (HOSPITAL_COMMUNITY): Payer: Self-pay | Admitting: Internal Medicine

## 2020-05-19 ENCOUNTER — Other Ambulatory Visit: Payer: Self-pay

## 2020-05-19 ENCOUNTER — Ambulatory Visit (HOSPITAL_COMMUNITY)
Admission: RE | Admit: 2020-05-19 | Discharge: 2020-05-19 | Disposition: A | Discharge status: Home / Self Care | Payer: Medicare PPO | Source: Ambulatory Visit | Attending: Internal Medicine | Admitting: Internal Medicine

## 2020-05-19 ENCOUNTER — Ambulatory Visit (INDEPENDENT_AMBULATORY_CARE_PROVIDER_SITE_OTHER)
Admission: RE | Admit: 2020-05-19 | Discharge: 2020-05-19 | Disposition: A | Discharge status: Home / Self Care | Payer: Medicare PPO | Source: Ambulatory Visit | Attending: Internal Medicine | Admitting: Internal Medicine

## 2020-05-19 DIAGNOSIS — I739 Peripheral vascular disease, unspecified: Secondary | ICD-10-CM

## 2020-08-02 ENCOUNTER — Ambulatory Visit: Payer: Medicare Other | Admitting: Podiatry

## 2020-08-02 ENCOUNTER — Other Ambulatory Visit: Payer: Self-pay

## 2020-08-02 DIAGNOSIS — M79674 Pain in right toe(s): Secondary | ICD-10-CM

## 2020-08-02 DIAGNOSIS — M79675 Pain in left toe(s): Secondary | ICD-10-CM

## 2020-08-02 DIAGNOSIS — B351 Tinea unguium: Secondary | ICD-10-CM

## 2020-08-02 MED ORDER — UREA NAIL 45 % EX GEL: 1.0000 "application " | Freq: Every day | CUTANEOUS | 0 refills | Status: DC

## 2020-08-09 NOTE — Progress Notes (Signed)
Subjective:   Patient ID: Sara Reilly, female   DOB: 87 y.o.   MRN: [id number]   HPI 87 year old female presents the office with concerns of thick, discolored toenails that she cannot trim her self in particular her big toenails of the worse.  No redness or drainage or any swelling to the toenail sites.  She has no other concerns today.   Review of Systems  All other systems reviewed and are negative.  Past Medical History:  Diagnosis Date  . Breast CA ([place])   . Diabetes ([place])   . Diverticulosis   . Fecal incontinence   . Hypertension   . IBS (irritable bowel syndrome)     Past Surgical History:  Procedure Laterality Date  . APPENDECTOMY    . BREAST LUMPECTOMY    . CHOLECYSTECTOMY    . COLONOSCOPY       Current Outpatient Medications:  .  Urea (UREA NAIL) 45 % GEL, Apply 1 application topically daily., Disp: 28 mL, Rfl: 0 .  acetaminophen (TYLENOL) 325 MG tablet, Take 650 mg by mouth 2 (two) times daily as needed., Disp: , Rfl:  .  atorvastatin (LIPITOR) 20 MG tablet, Take 20 mg by mouth daily., Disp: , Rfl:  .  bimatoprost (LUMIGAN) 0.01 % SOLN, 1 drop at bedtime., Disp: , Rfl:  .  cholecalciferol (VITAMIN D) 1000 UNITS tablet, Take 1,000 Units by mouth daily., Disp: , Rfl:  .  cyclobenzaprine (FLEXERIL) 10 MG tablet, Take 1 tablet (10 mg total) by mouth 2 (two) times daily as needed for muscle spasms., Disp: 20 tablet, Rfl: 0 .  diphenoxylate-atropine (LOMOTIL) 2.5-0.025 MG tablet, Take by mouth., Disp: , Rfl:  .  donepezil (ARICEPT) 5 MG tablet, Take 5 mg by mouth at bedtime as needed., Disp: , Rfl:  .  dorzolamide-timolol (COSOPT) 22.3-6.8 MG/ML ophthalmic solution, Place 1 drop into the left eye 2 (two) times daily., Disp: , Rfl:  .  Ferrous Gluconate (IRON) 240 (27 Fe) MG TABS, TAKE ONE TABLET EVERY OTHER DAY WITH 8 OUNCES OF WATER, Disp: , Rfl:  .  gabapentin (NEURONTIN) 300 MG capsule, Take 300 mg by mouth daily., Disp: , Rfl:  .  HYDROcodone-acetaminophen  (NORCO/VICODIN) 5-325 MG tablet, Take 1 tablet by mouth every 6 (six) hours as needed., Disp: 15 tablet, Rfl: 0 .  latanoprost (XALATAN) 0.005 % ophthalmic solution, INSTILL 1 DROP INTO BOTH EYES IN THE EVENING, Disp: , Rfl:  .  memantine (NAMENDA) 10 MG tablet, Take 10 mg by mouth at bedtime., Disp: , Rfl:  .  metFORMIN (GLUCOPHAGE) 1000 MG tablet, Take 1,000 mg by mouth daily., Disp: , Rfl:  .  metoprolol succinate (TOPROL-XL) 25 MG 24 hr tablet, Take 12.5 mg by mouth daily., Disp: , Rfl:  .  montelukast (SINGULAIR) 10 MG tablet, Take 10 mg by mouth at bedtime., Disp: , Rfl:  .  omeprazole (PRILOSEC) 20 MG capsule, Take 20 mg by mouth daily., Disp: , Rfl:  .  phenazopyridine (PYRIDIUM) 200 MG tablet, Take 1 tablet (200 mg total) by mouth 3 (three) times daily., Disp: 6 tablet, Rfl: 0 .  ramipril (ALTACE) 10 MG tablet, Take 10 mg by mouth daily., Disp: , Rfl:  .  tiZANidine (ZANAFLEX) 4 MG tablet, Take by mouth., Disp: , Rfl:  .  traMADol (ULTRAM) 50 MG tablet, Take 50 mg by mouth 2 (two) times daily., Disp: , Rfl:  .  vitamin E 400 UNIT capsule, Take 400 Units by mouth daily., Disp: , Rfl:  No Known Allergies       Objective:  Physical Exam  General: AAO x3, NAD  Dermatological: Nails are hypertrophic, dystrophic, brittle, discolored, elongated 10. No surrounding redness or drainage. Tenderness nails 1-5 bilaterally. No open lesions or pre-ulcerative lesions are identified today.  Vascular: Dorsalis Pedis artery and Posterior Tibial artery pedal pulses are palpable bilateral with immedate capillary fill time.  There is no pain with calf compression, swelling, warmth, erythema.   Neruologic: Grossly intact via light touch bilateral.   Musculoskeletal: No other areas of discomfort identified today.     Assessment:   Symptomatic onychomycosis     Plan:  -Treatment options discussed including all alternatives, risks, and complications -Etiology of symptoms were discussed -Nails  debrided 10 without complications or bleeding. -Daily foot inspection -Follow-up in 3 months or sooner if any problems arise. In the meantime, encouraged to call the office with any questions, concerns, change in symptoms.   Celesta Gentile, DPM

## 2020-08-12 ENCOUNTER — Other Ambulatory Visit: Payer: Self-pay | Admitting: Podiatry

## 2020-09-13 ENCOUNTER — Telehealth: Payer: Self-pay | Admitting: Podiatry

## 2020-09-13 NOTE — Telephone Encounter (Signed)
She can try to get it OTC as well. Other than that I would recommend routine debridement of the nails

## 2020-09-13 NOTE — Telephone Encounter (Signed)
Patient called and stated that the Urea Gel 45% can not be filled by her pharmacy, is there anything else that can be prescribed.

## 2020-11-09 ENCOUNTER — Encounter: Payer: Self-pay | Admitting: Podiatry

## 2020-11-09 ENCOUNTER — Other Ambulatory Visit: Payer: Self-pay

## 2020-11-09 ENCOUNTER — Ambulatory Visit: Payer: Medicare Other | Admitting: Podiatry

## 2020-11-09 DIAGNOSIS — S99822A Other specified injuries of left foot, initial encounter: Secondary | ICD-10-CM

## 2020-11-09 DIAGNOSIS — B351 Tinea unguium: Secondary | ICD-10-CM | POA: Diagnosis not present

## 2020-11-09 DIAGNOSIS — M79675 Pain in left toe(s): Secondary | ICD-10-CM

## 2020-11-09 NOTE — Patient Instructions (Addendum)
Apply Betadine Solution to left great toe nailbed once daily for 2 weeks. Call office if you have any problems.  Do not apply the Urea 45% solution to the left great toenail for now. We may resume using after I see you at the next visit. You may continue to apply once daily to the right great toenail once daily.

## 2020-11-12 NOTE — Progress Notes (Signed)
  Subjective:  Patient ID: Sara Reilly, female    DOB: [birth year],  MRN: [id number]  87 y.o. female presents with preventative diabetic foot care and painful thick toenails that are difficult to trim. Pain interferes with ambulation. Aggravating factors include wearing enclosed shoe gear. Pain is relieved with periodic professional debridement..    Her daughter is present during today's visit.  Patient does not monitor blood glucose on a daily basis.  PCP: Rogers Blocker, MD.  Review of Systems: Negative except as noted in the HPI.   No Known Allergies  Objective:  There were no vitals filed for this visit. Constitutional Patient is a pleasant 87 y.o. African American female WD, WN in NAD. AAO x 3.  Vascular Capillary refill time to digits immediate b/l. Palpable pedal pulses b/l LE. Pedal hair absent. Lower extremity skin temperature gradient within normal limits. No pain with calf compression b/l. No edema noted b/l lower extremities. No cyanosis or clubbing noted.  Neurologic Normal speech. Protective sensation intact 5/5 intact bilaterally with 10g monofilament b/l.  Dermatologic Toenails 2-5 bilaterally and R hallux elongated, discolored, dystrophic, thickened, and crumbly with subungual debris and tenderness to dorsal palpation. There is evidence of subungual seroma of the L hallux. There is onycholysis of nailplate. There is no  tenderness to palpation. No erythema, no edema.  Orthopedic: Normal muscle strength 5/5 to all lower extremity muscle groups bilaterally. No pain crepitus or joint limitation noted with ROM b/l.   Assessment:   1. Dermatophytosis of nail   2. Pain in toes of both feet   3. Subungual injury of toe of left foot, initial encounter    Plan:  Patient was evaluated and treated and all questions answered.  Onychomycosis with pain -Nails palliatively debridement as below. -Educated on self-care  Procedure: Nail Debridement Rationale: Pain Type of  Debridement: manual, sharp debridement. Instrumentation: Nail nipper, rotary burr. Number of Nails: 10  -Examined patient. -Patient to continue soft, supportive shoe gear daily. -Subungual seroma evacuted left hallux and light bleeding addressed with Lumicain. Betadine Solution and bandaid applied. Printed instructions dispensed to patient on today's visit. Patient instructed to apply Betadine Solution to digit once daily for two weeks. -Toenails 2-5 bilaterally and R hallux debrided in length and girth without iatrogenic bleeding with sterile nail nipper and dremel.  -Patient to report any pedal injuries to medical professional immediately. -Patient/POA to call should there be question/concern in the interim.  Return in about 3 months (around 02/09/2021).  Marzetta Board, DPM

## 2021-02-15 ENCOUNTER — Encounter: Payer: Self-pay | Admitting: Podiatry

## 2021-02-15 ENCOUNTER — Ambulatory Visit (INDEPENDENT_AMBULATORY_CARE_PROVIDER_SITE_OTHER): Payer: Medicare Other | Admitting: Podiatry

## 2021-02-15 ENCOUNTER — Other Ambulatory Visit: Payer: Self-pay

## 2021-02-15 DIAGNOSIS — S99822A Other specified injuries of left foot, initial encounter: Secondary | ICD-10-CM | POA: Diagnosis not present

## 2021-02-15 DIAGNOSIS — M79675 Pain in left toe(s): Secondary | ICD-10-CM | POA: Diagnosis not present

## 2021-02-15 DIAGNOSIS — E119 Type 2 diabetes mellitus without complications: Secondary | ICD-10-CM | POA: Diagnosis not present

## 2021-02-15 DIAGNOSIS — B351 Tinea unguium: Secondary | ICD-10-CM | POA: Diagnosis not present

## 2021-02-15 DIAGNOSIS — M79674 Pain in right toe(s): Secondary | ICD-10-CM

## 2021-02-15 NOTE — Patient Instructions (Signed)
Apply Triple antibiotic ointment to both great toes once daily.  Wear toe cap on right great toe when wearing shoes. Remove every evening. Do not sleep with toe cap on.

## 2021-02-17 NOTE — Progress Notes (Signed)
  Subjective:  Patient ID: Sara Reilly, female    DOB: 02-Oct-1929,  MRN: [id number]  87 y.o. female presents with preventative diabetic foot care and painful thick toenails that are difficult to trim. Pain interferes with ambulation. Aggravating factors include wearing enclosed shoe gear. Pain is relieved with periodic professional debridement..    Her daughter is present during today's visit.  Patient does not monitor blood glucose on a daily basis.  She is s/p subungual injury of bilateral great toes. Patient states she has been applying betadine daily since her last visit. She was instructed to apply it once daily for two weeks.  PCP: Rogers Blocker, MD.  Review of Systems: Negative except as noted in the HPI.   No Known Allergies  Objective:  There were no vitals filed for this visit. Constitutional Patient is a pleasant 87 y.o. African American female WD, WN in NAD. AAO x 3.  Vascular Capillary refill time to digits immediate b/l. Palpable pedal pulses b/l LE. Pedal hair absent. Lower extremity skin temperature gradient within normal limits. No pain with calf compression b/l. No edema noted b/l lower extremities. No cyanosis or clubbing noted.  Neurologic Normal speech. Protective sensation intact 5/5 intact bilaterally with 10g monofilament b/l.  Dermatologic Toenails 2-5 bilaterally and R hallux elongated, discolored, dystrophic, thickened, and crumbly with subungual debris and tenderness to dorsal palpation. Left hallux seroma has resolved.  Orthopedic: Normal muscle strength 5/5 to all lower extremity muscle groups bilaterally. No pain crepitus or joint limitation noted with ROM b/l.   Assessment:   1. Pain due to onychomycosis of toenails of both feet   2. Subungual injury of toe of left foot, initial encounter   3. Controlled type 2 diabetes mellitus without complication, without long-term current use of insulin ([place])    Plan:  -Examined patient. -Patient to continue soft,  supportive shoe gear daily. -Toenails 2-5 bilaterally and R hallux debrided in length and girth without iatrogenic bleeding with sterile nail nipper and dremel.  -She was instructed to discontinue betadine solution. She is to apply triple antibiotic ointment to both great toes once daily. . -Dispensed digital toe cap for right great toe. Apply every morning. Remove every evening.  -Patient to report any pedal injuries to medical professional immediately. -Patient/POA to call should there be question/concern in the interim.  Return in about 3 months (around 05/17/2021).  Marzetta Board, DPM

## 2021-03-10 ENCOUNTER — Other Ambulatory Visit: Payer: Self-pay | Admitting: Internal Medicine

## 2021-03-10 ENCOUNTER — Ambulatory Visit
Admission: RE | Admit: 2021-03-10 | Discharge: 2021-03-10 | Disposition: A | Discharge status: Home / Self Care | Payer: Medicare Other | Source: Ambulatory Visit | Attending: Internal Medicine | Admitting: Internal Medicine

## 2021-03-10 DIAGNOSIS — R079 Chest pain, unspecified: Secondary | ICD-10-CM

## 2021-05-29 ENCOUNTER — Ambulatory Visit: Payer: Medicare Other | Admitting: Podiatry

## 2021-08-15 ENCOUNTER — Ambulatory Visit (INDEPENDENT_AMBULATORY_CARE_PROVIDER_SITE_OTHER): Payer: Medicare Other | Admitting: Podiatry

## 2021-08-15 ENCOUNTER — Encounter: Payer: Self-pay | Admitting: Podiatry

## 2021-08-15 ENCOUNTER — Other Ambulatory Visit: Payer: Self-pay

## 2021-08-15 DIAGNOSIS — M79674 Pain in right toe(s): Secondary | ICD-10-CM | POA: Diagnosis not present

## 2021-08-15 DIAGNOSIS — E119 Type 2 diabetes mellitus without complications: Secondary | ICD-10-CM

## 2021-08-15 DIAGNOSIS — M79675 Pain in left toe(s): Secondary | ICD-10-CM | POA: Diagnosis not present

## 2021-08-15 DIAGNOSIS — B351 Tinea unguium: Secondary | ICD-10-CM

## 2021-08-20 NOTE — Progress Notes (Signed)
?  Subjective:  ?Patient ID: Sara Reilly, female    DOB: May 12, 1930,  MRN: [id number] ? ?Sara Reilly presents to clinic today for painful thick toenails that are difficult to trim. Pain interferes with ambulation. Aggravating factors include wearing enclosed shoe gear. Pain is relieved with periodic professional debridement. ? ?She is accompanied by her daughter on today's visit. Patient did not check blood glucose today. ? ?New problem(s): None.  ? ?PCP is Sara Blocker, MD , and last visit was last week. ? ?No Known Allergies ? ?Review of Systems: Negative except as noted in the HPI. ? ?Objective: No changes noted in today's physical examination. ?Constitutional Patient is a pleasant 88 y.o. African American female WD, WN in NAD. AAO x 3.  ?Vascular Capillary refill time to digits immediate b/l. Palpable pedal pulses b/l LE. Pedal hair absent. Lower extremity skin temperature gradient within normal limits. No pain with calf compression b/l. No edema noted b/l lower extremities. No cyanosis or clubbing noted.  ?Neurologic Normal speech. Protective sensation intact 5/5 intact bilaterally with 10g monofilament b/l.  ?Dermatologic Pedal skin thin and atrophic b/l. Toenails 1-5 bilaterally elongated, discolored, dystrophic, thickened, and crumbly with subungual debris and tenderness to dorsal palpation.   ?Orthopedic: Normal muscle strength 5/5 to all lower extremity muscle groups bilaterally. No pain crepitus or joint limitation noted with ROM b/l.  ?Assessment/Plan: ?1. Pain due to onychomycosis of toenails of both feet   ?2. Controlled type 2 diabetes mellitus without complication, without long-term current use of insulin ([place])   ?-Patient was evaluated and treated. All patient's and/or POA's questions/concerns answered on today's visit. ?-Mycotic toenails 1-5 bilaterally were debrided in length and girth with sterile nail nippers and dremel without incident. ?-Patient/POA to call should there be question/concern  in the interim.  ? ?Return in about 3 months (around 11/15/2021). ? ?Marzetta Board, DPM  ?

## 2021-11-22 ENCOUNTER — Encounter: Payer: Self-pay | Admitting: Podiatry

## 2021-11-22 ENCOUNTER — Ambulatory Visit (INDEPENDENT_AMBULATORY_CARE_PROVIDER_SITE_OTHER): Payer: Medicare Other | Admitting: Podiatry

## 2021-11-22 DIAGNOSIS — M79675 Pain in left toe(s): Secondary | ICD-10-CM

## 2021-11-22 DIAGNOSIS — E119 Type 2 diabetes mellitus without complications: Secondary | ICD-10-CM | POA: Diagnosis not present

## 2021-11-22 DIAGNOSIS — M79674 Pain in right toe(s): Secondary | ICD-10-CM | POA: Diagnosis not present

## 2021-11-22 DIAGNOSIS — B351 Tinea unguium: Secondary | ICD-10-CM

## 2021-11-28 NOTE — Progress Notes (Signed)
  Subjective:  Patient ID: Sara Reilly, female    DOB: 01/07/1930,  MRN: [id number]  Sara Reilly presents to clinic today for painful thick toenails that are difficult to trim. Pain interferes with ambulation. Aggravating factors include wearing enclosed shoe gear. Pain is relieved with periodic professional debridement.  New problem(s): None.   Patient did not check her blood glucose on today. Last known HgA1c was 7.5%.  PCP is Rogers Blocker, MD , and last visit was June, 2023.  No Known Allergies  Review of Systems: Negative except as noted in the HPI.  Objective:  Constitutional Patient is a pleasant 88 y.o. African American female WD, WN in NAD. AAO x 3.  Vascular Capillary refill time to digits immediate b/l. Palpable pedal pulses b/l LE. Pedal hair absent. Lower extremity skin temperature gradient within normal limits. No pain with calf compression b/l. No edema noted b/l lower extremities. No cyanosis or clubbing noted. Patient wearing compression knee highs b/l.  Neurologic Normal speech. Protective sensation intact 5/5 intact bilaterally with 10g monofilament b/l.  Dermatologic Pedal skin thin and atrophic b/l. Toenails 1-5 bilaterally elongated, discolored, dystrophic, thickened, and crumbly with subungual debris and tenderness to dorsal palpation.   Orthopedic: Normal muscle strength 5/5 to all lower extremity muscle groups bilaterally. No pain crepitus or joint limitation noted with ROM b/l. Wearing Skechers shoes on today. Utilizing rollator for gait assistance.   Assessment/Plan: No diagnosis found.   -Examined patient. -Continue foot and shoe inspections daily. Monitor blood glucose per PCP/Endocrinologist's recommendations. -Patient to continue soft, supportive shoe gear daily. -Toenails 1-5 b/l were debrided in length and girth with sterile nail nippers and dremel without iatrogenic bleeding.  -Patient/POA to call should there be question/concern in the interim.    Return in about 3 months (around 02/22/2022).  Marzetta Board, DPM

## 2021-12-14 ENCOUNTER — Other Ambulatory Visit: Payer: Self-pay | Admitting: Physician Assistant

## 2021-12-14 DIAGNOSIS — R634 Abnormal weight loss: Secondary | ICD-10-CM

## 2021-12-14 DIAGNOSIS — R1032 Left lower quadrant pain: Secondary | ICD-10-CM

## 2021-12-14 DIAGNOSIS — R10814 Left lower quadrant abdominal tenderness: Secondary | ICD-10-CM

## 2021-12-27 ENCOUNTER — Ambulatory Visit
Admission: RE | Admit: 2021-12-27 | Discharge: 2021-12-27 | Disposition: A | Discharge status: Home / Self Care | Payer: Medicare Other | Source: Ambulatory Visit | Attending: Physician Assistant | Admitting: Physician Assistant

## 2021-12-27 DIAGNOSIS — R10814 Left lower quadrant abdominal tenderness: Secondary | ICD-10-CM

## 2021-12-27 DIAGNOSIS — R1032 Left lower quadrant pain: Secondary | ICD-10-CM

## 2021-12-27 DIAGNOSIS — R634 Abnormal weight loss: Secondary | ICD-10-CM

## 2021-12-27 MED ORDER — IOPAMIDOL (ISOVUE-300) INJECTION 61%
75.0000 mL | Freq: Once | INTRAVENOUS | Status: AC | PRN
  Administered 2021-12-27: 75 mL via INTRAVENOUS

## 2022-02-27 ENCOUNTER — Ambulatory Visit: Payer: Medicare Other | Admitting: Podiatry

## 2022-03-21 ENCOUNTER — Encounter (HOSPITAL_COMMUNITY): Payer: Self-pay | Admitting: Emergency Medicine

## 2022-03-21 ENCOUNTER — Other Ambulatory Visit: Payer: Self-pay

## 2022-03-21 ENCOUNTER — Inpatient Hospital Stay (HOSPITAL_COMMUNITY)
Admission: EM | Admit: 2022-03-21 | Discharge: 2022-03-28 | DRG: 872 | Disposition: A | Discharge status: Skilled Nursing Facility | Payer: Medicare Other | Attending: Internal Medicine | Admitting: Internal Medicine

## 2022-03-21 ENCOUNTER — Emergency Department (HOSPITAL_COMMUNITY): Payer: Medicare Other

## 2022-03-21 ENCOUNTER — Other Ambulatory Visit (HOSPITAL_COMMUNITY): Payer: Medicare Other

## 2022-03-21 DIAGNOSIS — Z87891 Personal history of nicotine dependence: Secondary | ICD-10-CM

## 2022-03-21 DIAGNOSIS — Z853 Personal history of malignant neoplasm of breast: Secondary | ICD-10-CM | POA: Diagnosis not present

## 2022-03-21 DIAGNOSIS — K589 Irritable bowel syndrome without diarrhea: Secondary | ICD-10-CM

## 2022-03-21 DIAGNOSIS — Z794 Long term (current) use of insulin: Secondary | ICD-10-CM | POA: Diagnosis not present

## 2022-03-21 DIAGNOSIS — N10 Acute tubulo-interstitial nephritis: Secondary | ICD-10-CM | POA: Diagnosis present

## 2022-03-21 DIAGNOSIS — N390 Urinary tract infection, site not specified: Secondary | ICD-10-CM | POA: Diagnosis not present

## 2022-03-21 DIAGNOSIS — Z79899 Other long term (current) drug therapy: Secondary | ICD-10-CM

## 2022-03-21 DIAGNOSIS — I480 Paroxysmal atrial fibrillation: Secondary | ICD-10-CM | POA: Diagnosis present

## 2022-03-21 DIAGNOSIS — D509 Iron deficiency anemia, unspecified: Secondary | ICD-10-CM

## 2022-03-21 DIAGNOSIS — E119 Type 2 diabetes mellitus without complications: Secondary | ICD-10-CM | POA: Diagnosis not present

## 2022-03-21 DIAGNOSIS — K580 Irritable bowel syndrome with diarrhea: Secondary | ICD-10-CM | POA: Diagnosis present

## 2022-03-21 DIAGNOSIS — Z20822 Contact with and (suspected) exposure to covid-19: Secondary | ICD-10-CM | POA: Diagnosis present

## 2022-03-21 DIAGNOSIS — N12 Tubulo-interstitial nephritis, not specified as acute or chronic: Secondary | ICD-10-CM | POA: Diagnosis not present

## 2022-03-21 DIAGNOSIS — Z9049 Acquired absence of other specified parts of digestive tract: Secondary | ICD-10-CM

## 2022-03-21 DIAGNOSIS — E1165 Type 2 diabetes mellitus with hyperglycemia: Secondary | ICD-10-CM | POA: Diagnosis present

## 2022-03-21 DIAGNOSIS — A419 Sepsis, unspecified organism: Secondary | ICD-10-CM | POA: Diagnosis not present

## 2022-03-21 DIAGNOSIS — I10 Essential (primary) hypertension: Secondary | ICD-10-CM

## 2022-03-21 LAB — CBC WITH DIFFERENTIAL/PLATELET
Abs Immature Granulocytes: 0.17 10*3/uL — ABNORMAL HIGH (ref 0.00–0.07)
Basophils Absolute: 0 10*3/uL (ref 0.0–0.1)
Basophils Relative: 0 %
Eosinophils Absolute: 0 10*3/uL (ref 0.0–0.5)
Eosinophils Relative: 0 %
HCT: 26.9 % — ABNORMAL LOW (ref 36.0–46.0)
Hemoglobin: 8 g/dL — ABNORMAL LOW (ref 12.0–15.0)
Immature Granulocytes: 1 %
Lymphocytes Relative: 6 %
Lymphs Abs: 1.2 10*3/uL (ref 0.7–4.0)
MCH: 33.1 pg (ref 26.0–34.0)
MCHC: 29.7 g/dL — ABNORMAL LOW (ref 30.0–36.0)
MCV: 111.2 fL — ABNORMAL HIGH (ref 80.0–100.0)
Monocytes Absolute: 1.7 10*3/uL — ABNORMAL HIGH (ref 0.1–1.0)
Monocytes Relative: 8 %
Neutro Abs: 17.3 10*3/uL — ABNORMAL HIGH (ref 1.7–7.7)
Neutrophils Relative %: 85 %
Platelets: 270 10*3/uL (ref 150–400)
RBC: 2.42 MIL/uL — ABNORMAL LOW (ref 3.87–5.11)
RDW: 13.7 % (ref 11.5–15.5)
WBC: 20.3 10*3/uL — ABNORMAL HIGH (ref 4.0–10.5)
nRBC: 0 % (ref 0.0–0.2)

## 2022-03-21 LAB — LACTIC ACID, PLASMA
Lactic Acid, Venous: 1.1 mmol/L (ref 0.5–1.9)
Lactic Acid, Venous: 1 mmol/L (ref 0.5–1.9)

## 2022-03-21 LAB — URINALYSIS, ROUTINE W REFLEX MICROSCOPIC
Bilirubin Urine: NEGATIVE
Glucose, UA: NEGATIVE mg/dL
Hgb urine dipstick: NEGATIVE
Ketones, ur: 5 mg/dL — AB
Nitrite: NEGATIVE
Protein, ur: NEGATIVE mg/dL
Specific Gravity, Urine: >1.046 — ABNORMAL HIGH (ref 1.005–1.030)
WBC, UA: >50 WBC/hpf — ABNORMAL HIGH (ref 0–5)
pH: 5 (ref 5.0–8.0)

## 2022-03-21 LAB — COMPREHENSIVE METABOLIC PANEL
ALT: 14 U/L (ref 0–44)
AST: 22 U/L (ref 15–41)
Albumin: 2.5 g/dL — ABNORMAL LOW (ref 3.5–5.0)
Alkaline Phosphatase: 43 U/L (ref 38–126)
Anion gap: 14 (ref 5–15)
BUN: 23 mg/dL (ref 8–23)
CO2: 21 mmol/L — ABNORMAL LOW (ref 22–32)
Calcium: 8.9 mg/dL (ref 8.9–10.3)
Chloride: 104 mmol/L (ref 98–111)
Creatinine, Ser: 1.17 mg/dL — ABNORMAL HIGH (ref 0.44–1.00)
GFR, Estimated: 44 mL/min — ABNORMAL LOW (ref >60)
Glucose, Bld: 162 mg/dL — ABNORMAL HIGH (ref 70–99)
Potassium: 3.9 mmol/L (ref 3.5–5.1)
Sodium: 139 mmol/L (ref 135–145)
Total Bilirubin: 0.7 mg/dL (ref 0.3–1.2)
Total Protein: 6 g/dL — ABNORMAL LOW (ref 6.5–8.1)

## 2022-03-21 LAB — RESP PANEL BY RT-PCR (FLU A&B, COVID) ARPGX2
Influenza A by PCR: NEGATIVE
Influenza B by PCR: NEGATIVE
SARS Coronavirus 2 by RT PCR: NEGATIVE

## 2022-03-21 LAB — GLUCOSE, CAPILLARY
Glucose-Capillary: 149 mg/dL — ABNORMAL HIGH (ref 70–99)
Glucose-Capillary: 198 mg/dL — ABNORMAL HIGH (ref 70–99)
Glucose-Capillary: 261 mg/dL — ABNORMAL HIGH (ref 70–99)

## 2022-03-21 MED ORDER — ENOXAPARIN SODIUM 30 MG/0.3ML IJ SOSY
30.0000 mg | PREFILLED_SYRINGE | INTRAMUSCULAR | Status: DC
  Administered 2022-03-21 – 2022-03-28 (×8): 30 mg via SUBCUTANEOUS
  Filled 2022-03-21 (×8): qty 0.3

## 2022-03-21 MED ORDER — SODIUM CHLORIDE 0.9 % IV SOLN
2.0000 g | INTRAVENOUS | Status: AC
  Administered 2022-03-22 – 2022-03-28 (×7): 2 g via INTRAVENOUS
  Filled 2022-03-21 (×7): qty 20

## 2022-03-21 MED ORDER — ACETAMINOPHEN 650 MG RE SUPP: 650.0000 mg | Freq: Four times a day (QID) | RECTAL | Status: DC | PRN

## 2022-03-21 MED ORDER — HYDROXYZINE HCL 10 MG PO TABS: 10.0000 mg | ORAL_TABLET | Freq: Every day | ORAL | Status: DC | PRN

## 2022-03-21 MED ORDER — TRAMADOL HCL 50 MG PO TABS: 50.0000 mg | ORAL_TABLET | Freq: Every day | ORAL | Status: DC | PRN

## 2022-03-21 MED ORDER — NETARSUDIL-LATANOPROST 0.02-0.005 % OP SOLN: 1.0000 [drp] | Freq: Every day | OPHTHALMIC | Status: DC

## 2022-03-21 MED ORDER — ONDANSETRON HCL 4 MG PO TABS: 4.0000 mg | ORAL_TABLET | Freq: Four times a day (QID) | ORAL | Status: DC | PRN

## 2022-03-21 MED ORDER — METOPROLOL SUCCINATE ER 25 MG PO TB24
12.5000 mg | ORAL_TABLET | Freq: Every day | ORAL | Status: DC
  Administered 2022-03-21 – 2022-03-28 (×8): 12.5 mg via ORAL
  Filled 2022-03-21 (×8): qty 1

## 2022-03-21 MED ORDER — IOHEXOL 350 MG/ML SOLN
75.0000 mL | Freq: Once | INTRAVENOUS | Status: AC | PRN
  Administered 2022-03-21: 75 mL via INTRAVENOUS

## 2022-03-21 MED ORDER — DORZOLAMIDE HCL-TIMOLOL MAL 2-0.5 % OP SOLN
1.0000 [drp] | Freq: Two times a day (BID) | OPHTHALMIC | Status: DC
  Administered 2022-03-21: 1 [drp] via OPHTHALMIC
  Filled 2022-03-21: qty 10

## 2022-03-21 MED ORDER — ACETAMINOPHEN 325 MG PO TABS: 650.0000 mg | ORAL_TABLET | Freq: Four times a day (QID) | ORAL | Status: DC | PRN

## 2022-03-21 MED ORDER — ONDANSETRON HCL 4 MG/2ML IJ SOLN: 4.0000 mg | Freq: Four times a day (QID) | INTRAMUSCULAR | Status: DC | PRN

## 2022-03-21 MED ORDER — GABAPENTIN 300 MG PO CAPS
300.0000 mg | ORAL_CAPSULE | Freq: Every day | ORAL | Status: DC
  Administered 2022-03-21 – 2022-03-28 (×8): 300 mg via ORAL
  Filled 2022-03-21 (×8): qty 1

## 2022-03-21 MED ORDER — ATORVASTATIN CALCIUM 10 MG PO TABS
20.0000 mg | ORAL_TABLET | Freq: Every day | ORAL | Status: DC
  Administered 2022-03-21 – 2022-03-28 (×8): 20 mg via ORAL
  Filled 2022-03-21 (×8): qty 2

## 2022-03-21 MED ORDER — LACTATED RINGERS IV BOLUS
1000.0000 mL | Freq: Once | INTRAVENOUS | Status: AC
  Administered 2022-03-21: 1000 mL via INTRAVENOUS

## 2022-03-21 MED ORDER — DORZOLAMIDE HCL-TIMOLOL MAL 2-0.5 % OP SOLN
1.0000 [drp] | Freq: Two times a day (BID) | OPHTHALMIC | Status: DC
  Administered 2022-03-21 – 2022-03-28 (×14): 1 [drp] via OPHTHALMIC
  Filled 2022-03-21: qty 10

## 2022-03-21 MED ORDER — SODIUM CHLORIDE 0.9 % IV SOLN
2.0000 g | Freq: Once | INTRAVENOUS | Status: AC
  Administered 2022-03-21: 2 g via INTRAVENOUS
  Filled 2022-03-21: qty 20

## 2022-03-21 MED ORDER — MEMANTINE HCL 10 MG PO TABS
10.0000 mg | ORAL_TABLET | Freq: Every day | ORAL | Status: DC
  Administered 2022-03-21 – 2022-03-27 (×7): 10 mg via ORAL
  Filled 2022-03-21 (×8): qty 1

## 2022-03-21 NOTE — Care Management CC44 (Signed)
Condition Code 44 Documentation Completed  Patient Details  Name: AMELITA RISINGER MRN: [id number] Date of Birth: 1929/08/17   Condition Code 44 given:  Yes Patient signature on Condition Code 44 notice:  Yes Documentation of 2 MD's agreement:  Yes Code 44 added to claim:  Yes    Carles Collet, RN 03/21/2022, 4:28 PM

## 2022-03-21 NOTE — ED Provider Notes (Signed)
  Physical Exam  BP (!) 152/70   Pulse 95   Temp (!) 100.4 F (38 C) (Oral)   Resp (!) 31   Ht 1.549 m ('5\' 1"'$ )   Wt 51.9 kg   SpO2 92%   BMI 21.62 kg/m   Physical Exam  Procedures  Procedures  ED Course / MDM    Medical Decision Making Amount and/or Complexity of Data Reviewed Radiology: ordered.  Risk Prescription drug management. Decision regarding hospitalization.   88 yo female with uti sx ct with pyelo, fever, leukocytosis, blood cxs, rocephin ordered Plan admission pending ua Discussed with Dr. Nicoletta Dress who will see for admission        Pattricia Boss, MD 03/21/22 [phone]

## 2022-03-21 NOTE — ED Provider Notes (Signed)
[facility] EMERGENCY DEPARTMENT Provider Note  CSN: [id number] Arrival date & time: 03/21/22 0310  Chief Complaint(s) Gen. Weakness / Fatigue  (Hyperglycemia)  HPI Sara Reilly is a 88 y.o. female with PMH diabetes, IBS, HTN, iron deficiency anemia on iron who presents emergency department for evaluation of generalized fatigue and weakness.  She states that for the last 2 weeks she has felt progressively more fatigued with decreased appetite and p.o. intake.  She endorses some diarrhea over the last 2 weeks and dysuria as well as lower abdominal pain but denies chest pain, shortness of breath, headache, vomiting or other systemic symptoms.   Past Medical History Past Medical History:  Diagnosis Date   Breast CA ([place])    Diabetes ([place])    Diverticulosis    Fecal incontinence    Hypertension    IBS (irritable bowel syndrome)    There are no problems to display for this patient.  Home Medication(s) Prior to Admission medications   Medication Sig Start Date End Date Taking? Authorizing Provider  acetaminophen (TYLENOL) 325 MG tablet Take 650 mg by mouth 2 (two) times daily as needed.   Yes [provider]  atorvastatin (LIPITOR) 20 MG tablet Take 20 mg by mouth daily.   Yes [provider]  dorzolamide-timolol (COSOPT) 22.3-6.8 MG/ML ophthalmic solution Place 1 drop into the left eye 2 (two) times daily. 04/23/20  Yes [provider]  furosemide (LASIX) 20 MG tablet Take 20 mg by mouth 2 (two) times daily as needed for edema. 06/01/21  Yes [provider]  hydrOXYzine (ATARAX) 10 MG tablet Take 10 mg by mouth daily as needed for itching. 10/24/21  Yes [provider]  memantine (NAMENDA) 10 MG tablet Take 10 mg by mouth at bedtime. 06/09/20  Yes [provider]  metoprolol succinate (TOPROL-XL) 25 MG 24 hr tablet Take 12.5 mg by mouth daily. 06/26/20  Yes [provider]  omeprazole (PRILOSEC) 20 MG capsule  Take 20 mg by mouth daily.   Yes [provider]  ROCKLATAN 0.02-0.005 % SOLN Place 1 drop into both eyes at bedtime. 07/03/21  Yes [provider]  tiZANidine (ZANAFLEX) 4 MG tablet Take 4 mg by mouth daily as needed (for sciatica). 07/28/20  Yes [provider]  traMADol (ULTRAM) 50 MG tablet Take 50 mg by mouth daily as needed for moderate pain.   Yes [provider]  ACCU-CHEK GUIDE test strip daily. 10/13/21   [provider]  Accu-Chek Softclix Lancets lancets daily. 10/13/21   [provider]  ammonium lactate (LAC-HYDRIN) 12 % lotion Apply topically 2 (two) times daily. 10/24/21   [provider]  bimatoprost (LUMIGAN) 0.01 % SOLN 1 drop at bedtime. Patient not taking: Reported on 11/09/2020    [provider]  Blood Glucose Monitoring Suppl (ACCU-CHEK GUIDE) w/Device KIT See admin instructions. 10/13/21   [provider]  cholecalciferol (VITAMIN D) 1000 UNITS tablet Take 1,000 Units by mouth daily. Patient not taking: Reported on 11/09/2020    [provider]  cyclobenzaprine (FLEXERIL) 10 MG tablet Take 1 tablet (10 mg total) by mouth 2 (two) times daily as needed for muscle spasms. Patient not taking: Reported on 11/09/2020 07/13/16   Tegeler, Gwenyth Allegra, MD  diphenoxylate-atropine (LOMOTIL) 2.5-0.025 MG tablet Take by mouth. 07/28/20   [provider]  donepezil (ARICEPT) 5 MG tablet Take 5 mg by mouth at bedtime as needed. Patient not taking: Reported on 11/09/2020    [provider]  doxycycline (VIBRA-TABS) 100 MG tablet Take 100 mg by mouth 2 (two) times daily. 11/14/21   [provider]  Ferrous Gluconate (IRON) 240 (27 Fe) MG TABS TAKE ONE TABLET EVERY OTHER DAY WITH 8 OUNCES OF WATER 06/29/20   [provider]  gabapentin (NEURONTIN) 300 MG capsule Take 300 mg by mouth daily. 07/28/20   [provider]  glimepiride (AMARYL) 1 MG tablet Take 1 mg by mouth  daily. Patient not taking: Reported on 03/21/2022 09/05/21   [provider]  Ketorolac Tromethamine 300 MG/10ML SOLN Inject 1 mL into the muscle once. 05/25/21   [provider]  latanoprost (XALATAN) 0.005 % ophthalmic solution INSTILL 1 DROP INTO BOTH EYES IN THE EVENING 06/26/20   [provider]  nitroGLYCERIN (NITROSTAT) 0.4 MG SL tablet Place 0.4 mg under the tongue every 5 (five) minutes as needed for chest pain.    [provider]  Urea (UREA NAIL) 45 % GEL Apply 1 application topically daily. 08/02/20   Trula Slade, DPM                                                                                                                                    Past Surgical History Past Surgical History:  Procedure Laterality Date   APPENDECTOMY     BREAST LUMPECTOMY     CHOLECYSTECTOMY     COLONOSCOPY     Family History Family History  Problem Relation Age of Onset   Stroke Father     Social History Social History   Tobacco Use   Smoking status: Former   Smokeless tobacco: Never  Substance Use Topics   Alcohol use: No   Drug use: No   Allergies Patient has no known allergies.  Review of Systems Review of Systems  Constitutional:  Positive for chills and fatigue.  Genitourinary:  Positive for dysuria.    Physical Exam Vital Signs  I have reviewed the triage vital signs BP (!) 151/93   Pulse 91   Temp 99.5 F (37.5 C) (Oral)   Resp (!) 30   SpO2 97%   Physical Exam Vitals and nursing note reviewed.  Constitutional:      General: She is not in acute distress.    Appearance: She is well-developed.  HENT:     Head: Normocephalic and atraumatic.  Eyes:     Conjunctiva/sclera: Conjunctivae normal.  Cardiovascular:     Rate and Rhythm: Normal rate and regular rhythm.     Heart sounds: No murmur heard. Pulmonary:     Effort: Pulmonary effort is normal. No respiratory distress.     Breath sounds: Normal breath sounds.   Abdominal:     Palpations: Abdomen is soft.     Tenderness: There is abdominal tenderness.  Musculoskeletal:        General: No swelling.     Cervical back: Neck supple.  Skin:  General: Skin is warm and dry.     Capillary Refill: Capillary refill takes less than 2 seconds.  Neurological:     Mental Status: She is alert.  Psychiatric:        Mood and Affect: Mood normal.     ED Results and Treatments Labs (all labs ordered are listed, but only abnormal results are displayed) Labs Reviewed  CBC WITH DIFFERENTIAL/PLATELET - Abnormal; Notable for the following components:      Result Value   WBC 20.3 (*)    RBC 2.42 (*)    Hemoglobin 8.0 (*)    HCT 26.9 (*)    MCV 111.2 (*)    MCHC 29.7 (*)    Neutro Abs 17.3 (*)    Monocytes Absolute 1.7 (*)    Abs Immature Granulocytes 0.17 (*)    All other components within normal limits  COMPREHENSIVE METABOLIC PANEL - Abnormal; Notable for the following components:   CO2 21 (*)    Glucose, Bld 162 (*)    Creatinine, Ser 1.17 (*)    Total Protein 6.0 (*)    Albumin 2.5 (*)    GFR, Estimated 44 (*)    All other components within normal limits  RESP PANEL BY RT-PCR (FLU A&B, COVID) ARPGX2  CULTURE, BLOOD (ROUTINE X 2)  CULTURE, BLOOD (ROUTINE X 2)  URINE CULTURE  LACTIC ACID, PLASMA  LACTIC ACID, PLASMA  URINALYSIS, ROUTINE W REFLEX MICROSCOPIC                                                                                                                          Radiology DG Chest 1 View  Result Date: 03/21/2022 CLINICAL DATA:  Sepsis EXAM: CHEST  1 VIEW COMPARISON:  03/10/2021 FINDINGS: Heart is normal size. Aortic atherosclerosis. Right basilar scarring, unchanged. No confluent airspace opacities or effusions. No acute bony abnormality. IMPRESSION: No active disease. Electronically Signed   By: Rolm Baptise M.D.   On: 03/21/2022 03:48    Pertinent labs & imaging results that were available during my care of the patient were  reviewed by me and considered in my medical decision making (see MDM for details).  Medications Ordered in ED Medications  lactated ringers bolus 1,000 mL (has no administration in time range)  Procedures Procedures  (including critical care time)  Medical Decision Making / ED Course   This patient presents to the ED for concern of ***, this involves an extensive number of treatment options, and is a complaint that carries with it a high risk of complications and morbidity.  The differential diagnosis includes ***  MDM: ***   Additional history obtained: -Additional history obtained from *** -External records from outside source obtained and reviewed including: Chart review including previous notes, labs, imaging, consultation notes   Lab Tests: -I ordered, reviewed, and interpreted labs.   The pertinent results include:   Labs Reviewed  CBC WITH DIFFERENTIAL/PLATELET - Abnormal; Notable for the following components:      Result Value   WBC 20.3 (*)    RBC 2.42 (*)    Hemoglobin 8.0 (*)    HCT 26.9 (*)    MCV 111.2 (*)    MCHC 29.7 (*)    Neutro Abs 17.3 (*)    Monocytes Absolute 1.7 (*)    Abs Immature Granulocytes 0.17 (*)    All other components within normal limits  COMPREHENSIVE METABOLIC PANEL - Abnormal; Notable for the following components:   CO2 21 (*)    Glucose, Bld 162 (*)    Creatinine, Ser 1.17 (*)    Total Protein 6.0 (*)    Albumin 2.5 (*)    GFR, Estimated 44 (*)    All other components within normal limits  RESP PANEL BY RT-PCR (FLU A&B, COVID) ARPGX2  CULTURE, BLOOD (ROUTINE X 2)  CULTURE, BLOOD (ROUTINE X 2)  URINE CULTURE  LACTIC ACID, PLASMA  LACTIC ACID, PLASMA  URINALYSIS, ROUTINE W REFLEX MICROSCOPIC      EKG ***  EKG Interpretation  Date/Time:  Wednesday March 21 2022 03:20:11 EDT Ventricular  Rate:  85 PR Interval:  114 QRS Duration: 70 QT Interval:  354 QTC Calculation: 421 R Axis:   -22 Text Interpretation: Normal sinus rhythm Anterior infarct , age undetermined Abnormal ECG Confirmed by Ripley Fraise [phone]) on 03/21/2022 3:45:29 AM         Imaging Studies ordered: I ordered imaging studies including *** I independently visualized and interpreted imaging. I agree with the radiologist interpretation   Medicines ordered and prescription drug management: Meds ordered this encounter  Medications   lactated ringers bolus 1,000 mL    -I have reviewed the patients home medicines and have made adjustments as needed  Critical interventions ***  Consultations Obtained: I requested consultation with the ***,  and discussed lab and imaging findings as well as pertinent plan - they recommend: ***   Cardiac Monitoring: The patient was maintained on a cardiac monitor.  I personally viewed and interpreted the cardiac monitored which showed an underlying rhythm of: ***  Social Determinants of Health:  Factors impacting patients care include: ***   Reevaluation: After the interventions noted above, I reevaluated the patient and found that they have :{resolved/improved/worsened:23923::"improved"}  Co morbidities that complicate the patient evaluation  Past Medical History:  Diagnosis Date   Breast CA ([place])    Diabetes ([place])    Diverticulosis    Fecal incontinence    Hypertension    IBS (irritable bowel syndrome)       Dispostion: I considered admission for this patient, ***     Final Clinical Impression(s) / ED Diagnoses Final diagnoses:  None     _0 @

## 2022-03-21 NOTE — Progress Notes (Signed)
Patient up to room in afternoon, doing well. Only complaint is cold. States abdomen does not hurt and is not tender. Admission completed at bedside. Son has been at bedside entire day. Patient tolerating PO intake well and had lunch today. Complaints of fatigue, chills and dysuria. Removed foley due to encouraging patient to go to bathroom and urinate. Encouraging patient to drinks lots of water as well. Patient has no other complaints or questions.

## 2022-03-21 NOTE — Care Management Obs Status (Signed)
[facility] NOTIFICATION   Patient Details  Name: Sara Reilly MRN: [id number] Date of Birth: 1930-01-24   Medicare Observation Status Notification Given:  Yes    Carles Collet, RN 03/21/2022, 4:28 PM

## 2022-03-21 NOTE — ED Notes (Signed)
ED provider at bedside at this time 

## 2022-03-21 NOTE — ED Notes (Signed)
Pt transported to radiology at this time °

## 2022-03-21 NOTE — ED Notes (Signed)
PT informed of need for urine sample. PT requested to call out when she needs to urinate

## 2022-03-21 NOTE — H&P (Addendum)
History and Physical    [name] PPJ:[id number] DOB: [birth year] DOA: 03/21/2022  PCP: Rogers Blocker, MD Patient coming from: home  Chief Complaint:  Fevers, Chills and Dysuria    HPI: Sara Reilly is a 88 y.o. female with medical history significant of T2DM, HTN, IDA, IBS, previous breast cancer status post  lumpectomy who presented with fevers, chills and dysuria.   Patient reports subjective fevers, chills and dysuria over the last 2-3 days.  Associate symptoms included generalized weakness, fatigue and decreased appetite.  She is not sure whether she had urinary frequency or urgency.  Patient was evaluated by EMS daily over the last 2 days.  She initially did not want to come to hospital, and her PCP prescribed her an antibiotic cefpodoxime 200 mg po daily. However, her symptoms persisted.  When EMS visit her yesterday, her Tmax was 100.7 F, and she was brought to the ED for further evaluation.  In the emergency room, Tmax 100.4 F, pulse 87, RR 16, BP 90/67.  Room air O2 sats 92-95%.  The labs showed WBC 20.3, hemoglobin 8.0, lactic acid 1.1, negative COVID/flu. EKG showed NSR and follow up EKG showed A Fib RVR. Marland KitchenPatient received LR 1000 ml IV x 1. Ceftriaxone 2 g IV x 1.    Denies sick contacts, cough, wheezing, shortness of breath, chest pain/pressure, palpitations, nausea, vomiting, diarrhea, abdominal pain.    Review of Systems: As per HPI otherwise 10 point review of systems negative.  Review of Systems Otherwise negative except as per HPI, including: General: Denies night sweats or unintended weight loss.  Positive for fevers, chills, generalized weakness, fatigue, decreased appetite. Resp: Denies cough, wheezing, shortness of breath. Cardiac: Denies chest pain, palpitations, orthopnea, paroxysmal nocturnal dyspnea. GI: Denies abdominal pain, nausea, vomiting, diarrhea or constipation GU: Denies requency, hesitancy or incontinence.  Positive for dysuria. MS: Denies muscle  aches, joint pain or swelling Neuro: Denies headache, neurologic deficits (focal weakness, numbness, tingling), abnormal gait Psych: Denies anxiety, depression, SI/HI/AVH Skin: Denies new rashes or lesions ID: Denies sick contacts, exotic exposures, travel  Past Medical History:  Diagnosis Date   Breast CA ([place])    Diabetes ([place])    Diverticulosis    Fecal incontinence    Hypertension    IBS (irritable bowel syndrome)     Past Surgical History:  Procedure Laterality Date   APPENDECTOMY     BREAST LUMPECTOMY     CHOLECYSTECTOMY     COLONOSCOPY      SOCIAL HISTORY:  reports that she has quit smoking. She has never used smokeless tobacco. She reports that she does not drink alcohol and does not use drugs.  No Known Allergies  FAMILY HISTORY: Family History  Problem Relation Age of Onset   Stroke Father      Prior to Admission medications   Medication Sig Start Date End Date Taking? Authorizing Provider  acetaminophen (TYLENOL) 325 MG tablet Take 650 mg by mouth 2 (two) times daily as needed.   Yes [provider]  ammonium lactate (LAC-HYDRIN) 12 % lotion Apply topically 2 (two) times daily. 10/24/21  Yes [provider]  atorvastatin (LIPITOR) 20 MG tablet Take 20 mg by mouth daily.   Yes [provider]  dorzolamide-timolol (COSOPT) 22.3-6.8 MG/ML ophthalmic solution Place 1 drop into the left eye 2 (two) times daily. 04/23/20  Yes [provider]  furosemide (LASIX) 20 MG tablet Take 20 mg by mouth 2 (two) times daily as needed for edema. 06/01/21  Yes [provider]  hydrOXYzine (ATARAX) 10 MG tablet Take 10 mg by mouth daily as needed for itching. 10/24/21  Yes [provider]  memantine (NAMENDA) 10 MG tablet Take 10 mg by mouth at bedtime. 06/09/20  Yes [provider]  metoprolol succinate (TOPROL-XL) 25 MG 24 hr tablet Take 12.5 mg by mouth daily. 06/26/20  Yes [provider]  omeprazole (PRILOSEC) 20  MG capsule Take 20 mg by mouth daily.   Yes [provider]  ROCKLATAN 0.02-0.005 % SOLN Place 1 drop into both eyes at bedtime. 07/03/21  Yes [provider]  tiZANidine (ZANAFLEX) 4 MG tablet Take 4 mg by mouth daily as needed (for sciatica). 07/28/20  Yes [provider]  traMADol (ULTRAM) 50 MG tablet Take 50 mg by mouth daily as needed for moderate pain.   Yes [provider]  ACCU-CHEK GUIDE test strip daily. 10/13/21   [provider]  Accu-Chek Softclix Lancets lancets daily. 10/13/21   [provider]  Blood Glucose Monitoring Suppl (ACCU-CHEK GUIDE) w/Device KIT See admin instructions. 10/13/21   [provider]  cefpodoxime (VANTIN) 200 MG tablet Take 200 mg by mouth daily. Patient not taking: Reported on 03/21/2022 03/19/22   [provider]  Ferrous Gluconate (IRON) 240 (27 Fe) MG TABS TAKE ONE TABLET EVERY OTHER DAY WITH 8 OUNCES OF WATER 06/29/20   [provider]  gabapentin (NEURONTIN) 300 MG capsule Take 300 mg by mouth daily. 07/28/20   [provider]  glimepiride (AMARYL) 1 MG tablet Take 1 mg by mouth daily. Patient not taking: Reported on 03/21/2022 09/05/21   [provider]  nitroGLYCERIN (NITROSTAT) 0.4 MG SL tablet Place 0.4 mg under the tongue every 5 (five) minutes as needed for chest pain. Patient not taking: Reported on 03/21/2022    [provider]    Physical Exam: Vitals:   03/21/22 0315 03/21/22 0543 03/21/22 0616 03/21/22 0700  BP: 90/67 (!) 151/93 110/87 (!) 152/70  Pulse: 87 91 86 95  Resp: 16 (!) 30 20 (!) 31  Temp: 99.5 F (37.5 C)  (!) 100.4 F (38 C)   TempSrc: Oral  Oral   SpO2: (!) 89% 97% 96% 92%  Weight:   51.9 kg   Height:   _0  (1.549 m)       Constitutional: NAD, calm, comfortable.  Acutely ill-appearing. Eyes: PERRL, lids and conjunctivae normal ENMT: Mucous membranes are moist. Posterior pharynx clear of any exudate or lesions.Normal  dentition.  Neck: normal, supple, no masses, no thyromegaly Respiratory: clear to auscultation bilaterally, no wheezing, no crackles. Normal respiratory effort. No accessory muscle use.  Cardiovascular: Regular rate and rhythm, no murmurs / rubs / gallops. No extremity edema. 2+ pedal pulses. No carotid bruits.  Abdomen: no tenderness, no masses palpated. No hepatosplenomegaly. Bowel sounds positive.  Left CVA tenderness Musculoskeletal: no clubbing / cyanosis. No joint deformity upper and lower extremities. Good ROM, no contractures. Normal muscle tone.  Skin: no rashes, lesions, ulcers. No induration Neurologic: CN 2-12 grossly intact. Sensation intact, DTR normal. Strength 5/5 in all 4.  Psychiatric: Normal judgment and insight. Alert and oriented x 3. Normal mood.     Labs on Admission: I have personally reviewed following labs and imaging studies  CBC: Recent Labs  Lab 03/21/22 0314  WBC 20.3*  NEUTROABS 17.3*  HGB 8.0*  HCT 26.9*  MCV 111.2*  PLT 824   Basic Metabolic Panel: Recent Labs  Lab 03/21/22 0314  Caral Whan  139  K 3.9  CL 104  CO2 21*  GLUCOSE 162*  BUN 23  CREATININE 1.17*  CALCIUM 8.9   GFR: Estimated Creatinine Clearance: 24.1 mL/min (A) (by C-G formula based on SCr of 1.17 mg/dL (H)). Liver Function Tests: Recent Labs  Lab 03/21/22 0314  AST 22  ALT 14  ALKPHOS 43  BILITOT 0.7  PROT 6.0*  ALBUMIN 2.5*   No results for input(s): "LIPASE", "AMYLASE" in the last 168 hours. No results for input(s): "AMMONIA" in the last 168 hours. Coagulation Profile: No results for input(s): "INR", "PROTIME" in the last 168 hours. Cardiac Enzymes: No results for input(s): "CKTOTAL", "CKMB", "CKMBINDEX", "TROPONINI" in the last 168 hours. BNP (last 3 results) No results for input(s): "PROBNP" in the last 8760 hours. HbA1C: No results for input(s): "HGBA1C" in the last 72 hours. CBG: No results for input(s): "GLUCAP" in the last 168 hours. Lipid Profile: No  results for input(s): "CHOL", "HDL", "LDLCALC", "TRIG", "CHOLHDL", "LDLDIRECT" in the last 72 hours. Thyroid Function Tests: No results for input(s): "TSH", "T4TOTAL", "FREET4", "T3FREE", "THYROIDAB" in the last 72 hours. Anemia Panel: No results for input(s): "VITAMINB12", "FOLATE", "FERRITIN", "TIBC", "IRON", "RETICCTPCT" in the last 72 hours. Urine analysis:    Component Value Date/Time   COLORURINE YELLOW 07/13/2016 1548   APPEARANCEUR HAZY (A) 07/13/2016 1548   LABSPEC 1.015 04/24/2017 1653   PHURINE 5.0 04/24/2017 1653   GLUCOSEU NEGATIVE 04/24/2017 1653   HGBUR SMALL (A) 04/24/2017 1653   BILIRUBINUR MODERATE (A) 04/24/2017 1653   KETONESUR 15 (A) 04/24/2017 1653   PROTEINUR >=300 (A) 04/24/2017 1653   UROBILINOGEN >=8.0 04/24/2017 1653   NITRITE POSITIVE (A) 04/24/2017 1653   LEUKOCYTESUR LARGE (A) 04/24/2017 1653   Sepsis Labs: !!!!!!!!!!!!!!!!!!!!!!!!!!!!!!!!!!!!!!!!!!!! _0 (procalcitonin:4,lacticidven:4) ) Recent Results (from the past 240 hour(s))  Resp Panel by RT-PCR (Flu A&B, Covid) Anterior Nasal Swab     Status: None   Collection Time: 03/21/22  3:12 AM   Specimen: Anterior Nasal Swab  Result Value Ref Range Status   SARS Coronavirus 2 by RT PCR NEGATIVE NEGATIVE Final    Comment: (NOTE) SARS-CoV-2 target nucleic acids are NOT DETECTED.  The SARS-CoV-2 RNA is generally detectable in upper respiratory specimens during the acute phase of infection. The lowest concentration of SARS-CoV-2 viral copies this assay can detect is 138 copies/mL. A negative result does not preclude SARS-Cov-2 infection and should not be used as the sole basis for treatment or other patient management decisions. A negative result may occur with  improper specimen collection/handling, submission of specimen other than nasopharyngeal swab, presence of viral mutation(s) within the areas targeted by this assay, and inadequate number of viral copies(<138 copies/mL). A negative result  must be combined with clinical observations, patient history, and epidemiological information. The expected result is Negative.  Fact Sheet for Patients:  EntrepreneurPulse.com.au  Fact Sheet for Healthcare Providers:  IncredibleEmployment.be  This test is no t yet approved or cleared by the [place] FDA and  has been authorized for detection and/or diagnosis of SARS-CoV-2 by FDA under an Emergency Use Authorization (EUA). This EUA will remain  in effect (meaning this test can be used) for the duration of the COVID-19 declaration under Section 564(b)(1) of the Act, 21 U.S.C.section 360bbb-3(b)(1), unless the authorization is terminated  or revoked sooner.       Influenza A by PCR NEGATIVE NEGATIVE Final   Influenza B by PCR NEGATIVE NEGATIVE Final    Comment: (NOTE) The Xpert Xpress SARS-CoV-2/FLU/RSV plus assay is intended  as an aid in the diagnosis of influenza from Nasopharyngeal swab specimens and should not be used as a sole basis for treatment. Nasal washings and aspirates are unacceptable for Xpert Xpress SARS-CoV-2/FLU/RSV testing.  Fact Sheet for Patients: EntrepreneurPulse.com.au  Fact Sheet for Healthcare Providers: IncredibleEmployment.be  This test is not yet approved or cleared by the [place] FDA and has been authorized for detection and/or diagnosis of SARS-CoV-2 by FDA under an Emergency Use Authorization (EUA). This EUA will remain in effect (meaning this test can be used) for the duration of the COVID-19 declaration under Section 564(b)(1) of the Act, 21 U.S.C. section 360bbb-3(b)(1), unless the authorization is terminated or revoked.  Performed at [facility], [place] [address], [place], [place] [address]      Radiological Exams on Admission: CT ABDOMEN PELVIS W CONTRAST  Result Date: 03/21/2022 CLINICAL DATA:  Left lower quadrant pain EXAM: CT ABDOMEN AND PELVIS  WITH CONTRAST TECHNIQUE: Multidetector CT imaging of the abdomen and pelvis was performed using the standard protocol following bolus administration of intravenous contrast. RADIATION DOSE REDUCTION: This exam was performed according to the departmental dose-optimization program which includes automated exposure control, adjustment of the mA and/or kV according to patient size and/or use of iterative reconstruction technique. CONTRAST:  35m OMNIPAQUE IOHEXOL 350 MG/ML SOLN COMPARISON:  12/27/2021. FINDINGS: Lower chest: Scarring at the lung bases, stable. History of lumpectomy with scar-like appearance in the medial and lower right breast. Atheromatous calcification of the coronary arteries. Hepatobiliary: No focal liver abnormality.No evidence of biliary obstruction or stone. Pancreas: No acute finding. Cystic density in the pancreatic head measuring 18 x 9 mm, unchanged. Follow-up recommendations were previously provided. Spleen: Unremarkable. Adrenals/Urinary Tract: Negative adrenals. New hypoenhancement in the posterior aspect of the interpolar left kidney with possible urothelial thickening at the left renal pelvis. The regional fat is stranded. Findings most consistent with pyelonephritis given the appearance and rapid development. No discrete fluid collection although some liquefaction is possible due to the degree of central low-density. Unremarkable bladder. Stomach/Bowel: No obstruction. Numerous in generalized colonic diverticulosis. No visible bowel inflammation Vascular/Lymphatic: Extensive atheromatous plaque affecting the aorta and branch vessels with bilateral inflow stenoses. No mass or adenopathy. Reproductive:No pathologic findings. Other: No ascites or pneumoperitoneum. Musculoskeletal: Advanced spinal degeneration with mild scoliosis and L4-5 anterolisthesis. Advanced L4-5 spinal stenosis. Multilevel lumbar foraminal impingement. IMPRESSION: Findings of pyelonephritis on the left. Mild  liquefaction may be present but no abscess or hydronephrosis. Electronically Signed   By: JJorje GuildM.D.   On: 03/21/2022 07:02   DG Chest 1 View  Result Date: 03/21/2022 CLINICAL DATA:  Sepsis EXAM: CHEST  1 VIEW COMPARISON:  03/10/2021 FINDINGS: Heart is normal size. Aortic atherosclerosis. Right basilar scarring, unchanged. No confluent airspace opacities or effusions. No acute bony abnormality. IMPRESSION: No active disease. Electronically Signed   By: KRolm BaptiseM.D.   On: 03/21/2022 03:48     All images have been reviewed by me personally.  EKG: Independently reviewed.   Assessment/Plan Principal Problem:   [place] Active Problems:   Pyelonephritis of left kidney   Sepsis secondary to UTI (HCC)   Paroxysmal atrial fibrillation with RVR (HCC)   T2DM (type 2 diabetes mellitus) (HCC)   HTN (hypertension)   IBS (irritable bowel syndrome)   IDA (iron deficiency anemia)   History of breast cancer     Assessment and Plan: Assessment Plan 1  # Sepsis ( fever and leukocytosis) # Acute left pyelonephritis and urinary tract infection  -  WBC 20 K, lactic acid normal -Blood and urine culture pending (urine culture collected after antibiotics given in ED) -UA pending -CT abdomen showed left pyelonephritis with no evidence of obstructive uropathy -Antibiotics-empiric ceftriaxone 2 g IV daily (Previous urine culture showed pansensitive Klebsiella)  #questionable New onset A-fib with RVR  Noted on her EKG at 0602 am on 11/1, however, nurse reports no such rhythm on her heart monitor since she arrived at the ED  -Noted on her second EKG since arrival to the ED. She is currently NSR with controlled HR on monitor -no hx of A fib.  - nurse examined patient heart monitor and did not see such rhythm on her heart monitoring since she arrived at the ED.  -suspect that this is discrepancy.    #T2DM - HGB A1C pending - Glu AC and HS   #HTN  chronic and stable   #IBS - chronic  and stable  #IDA - chronic and follow up with PCP  - she denies bleeding  - BL HGB 10-11 but last HGB was in 2018 - HGB 8.0 this admission, could be her new normal -continue to FU with PCP   #Previous history of breast cancer status post a lumpectomy - noted       DVT prophylaxis: Lovenox Code Status: Full code Family Communication: daughter and daughter-in-law are at bedside Consults called: none  Admission status: inpt    Time Spent: 65 minutes.  >50% of the time was devoted to discussing the patients care, assessment, plan and disposition with other care givers along with counseling the patient about the risks and benefits of treatment.    Charlann Lange MD [facility]  If 7PM-7AM, please contact night-coverage   03/21/2022, 8:48 AM

## 2022-03-21 NOTE — ED Triage Notes (Signed)
Patient arrived with EMS from home reports fatigue /generalized weakness onset Monday , CBG=205.

## 2022-03-21 NOTE — ED Provider Triage Note (Signed)
Emergency Medicine Provider Triage Evaluation Note  Sara Reilly , a 88 y.o. female  was evaluated in triage.  Pt complains of feeling poorly/generalized weakness.  Recent UTI 1 month ago.  GP apparently started her on abx Monday for possible infection, however no testing performed.  Febrile with EMS to 100.68F, tachypneic.  Review of Systems  Positive: Generalized weakness Negative: vomiting  Physical Exam  BP 90/67 (BP Location: Left Arm)   Pulse 87   Temp 99.5 F (37.5 C) (Oral)   Resp 16   SpO2 (!) 89%   Gen:   Awake, no distress, elderly Resp:  Normal effort  MSK:   Moves extremities without difficulty  Other:    Medical Decision Making  Medically screening exam initiated at 3:09 AM.  Appropriate orders placed.  [name] was informed that the remainder of the evaluation will be completed by another provider, this initial triage assessment does not replace that evaluation, and the importance of remaining in the ED until their evaluation is complete.  Generalized weakness.  Started on abx by PCP earlier this week for possible infection, however no testing done.  Low grade fever 99.23F in triage, BP soft at 90/67.  Sepsis order set utilized, covid/flu screen, CXR, UA.   Larene Pickett, PA-C 03/21/22 [phone]

## 2022-03-22 DIAGNOSIS — E119 Type 2 diabetes mellitus without complications: Secondary | ICD-10-CM

## 2022-03-22 DIAGNOSIS — A419 Sepsis, unspecified organism: Secondary | ICD-10-CM | POA: Diagnosis not present

## 2022-03-22 DIAGNOSIS — Z853 Personal history of malignant neoplasm of breast: Secondary | ICD-10-CM | POA: Diagnosis not present

## 2022-03-22 DIAGNOSIS — N12 Tubulo-interstitial nephritis, not specified as acute or chronic: Secondary | ICD-10-CM | POA: Diagnosis not present

## 2022-03-22 DIAGNOSIS — I10 Essential (primary) hypertension: Secondary | ICD-10-CM | POA: Diagnosis not present

## 2022-03-22 DIAGNOSIS — Z794 Long term (current) use of insulin: Secondary | ICD-10-CM

## 2022-03-22 LAB — CBC
HCT: 24.5 % — ABNORMAL LOW (ref 36.0–46.0)
Hemoglobin: 7.3 g/dL — ABNORMAL LOW (ref 12.0–15.0)
MCH: 33 pg (ref 26.0–34.0)
MCHC: 29.8 g/dL — ABNORMAL LOW (ref 30.0–36.0)
MCV: 110.9 fL — ABNORMAL HIGH (ref 80.0–100.0)
Platelets: 255 10*3/uL (ref 150–400)
RBC: 2.21 MIL/uL — ABNORMAL LOW (ref 3.87–5.11)
RDW: 13.7 % (ref 11.5–15.5)
WBC: 11.1 10*3/uL — ABNORMAL HIGH (ref 4.0–10.5)
nRBC: 0 % (ref 0.0–0.2)

## 2022-03-22 LAB — HEMOGLOBIN A1C
Hgb A1c MFr Bld: 5.7 % — ABNORMAL HIGH (ref 4.8–5.6)
Mean Plasma Glucose: 116.89 mg/dL

## 2022-03-22 LAB — URINE CULTURE: Culture: NO GROWTH

## 2022-03-22 LAB — BASIC METABOLIC PANEL
Anion gap: 9 (ref 5–15)
BUN: 26 mg/dL — ABNORMAL HIGH (ref 8–23)
CO2: 24 mmol/L (ref 22–32)
Calcium: 8.5 mg/dL — ABNORMAL LOW (ref 8.9–10.3)
Chloride: 107 mmol/L (ref 98–111)
Creatinine, Ser: 1.25 mg/dL — ABNORMAL HIGH (ref 0.44–1.00)
GFR, Estimated: 41 mL/min — ABNORMAL LOW (ref >60)
Glucose, Bld: 239 mg/dL — ABNORMAL HIGH (ref 70–99)
Potassium: 4 mmol/L (ref 3.5–5.1)
Sodium: 140 mmol/L (ref 135–145)

## 2022-03-22 LAB — GLUCOSE, CAPILLARY
Glucose-Capillary: 163 mg/dL — ABNORMAL HIGH (ref 70–99)
Glucose-Capillary: 228 mg/dL — ABNORMAL HIGH (ref 70–99)
Glucose-Capillary: 131 mg/dL — ABNORMAL HIGH (ref 70–99)
Glucose-Capillary: 181 mg/dL — ABNORMAL HIGH (ref 70–99)

## 2022-03-22 LAB — TSH: TSH: 1.477 u[IU]/mL (ref 0.350–4.500)

## 2022-03-22 MED ORDER — GLUCERNA SHAKE PO LIQD
237.0000 mL | Freq: Two times a day (BID) | ORAL | Status: DC
  Administered 2022-03-23 – 2022-03-26 (×6): 237 mL via ORAL

## 2022-03-22 MED ORDER — INSULIN ASPART 100 UNIT/ML IJ SOLN
0.0000 [IU] | Freq: Three times a day (TID) | INTRAMUSCULAR | Status: DC
  Administered 2022-03-22: 2 [IU] via SUBCUTANEOUS
  Administered 2022-03-23 – 2022-03-24 (×2): 1 [IU] via SUBCUTANEOUS
  Administered 2022-03-24: 2 [IU] via SUBCUTANEOUS
  Administered 2022-03-25: 1 [IU] via SUBCUTANEOUS
  Administered 2022-03-25: 2 [IU] via SUBCUTANEOUS
  Administered 2022-03-26 – 2022-03-27 (×2): 1 [IU] via SUBCUTANEOUS
  Administered 2022-03-28: 2 [IU] via SUBCUTANEOUS

## 2022-03-22 MED ORDER — ADULT MULTIVITAMIN W/MINERALS CH
1.0000 | ORAL_TABLET | Freq: Every day | ORAL | Status: DC
  Administered 2022-03-23 – 2022-03-28 (×6): 1 via ORAL
  Filled 2022-03-22 (×6): qty 1

## 2022-03-22 MED ORDER — INSULIN ASPART 100 UNIT/ML IJ SOLN: 0.0000 [IU] | Freq: Every day | INTRAMUSCULAR | Status: DC

## 2022-03-22 NOTE — Progress Notes (Signed)
Initial Nutrition Assessment  DOCUMENTATION CODES:   Not applicable  INTERVENTION:  - Add Glucerna Shake po BID, each supplement provides 220 kcal and 10 grams of protein.   - Add MVI q day.   NUTRITION DIAGNOSIS:   Inadequate oral intake related to decreased appetite as evidenced by meal completion < 50%.  GOAL:   Patient will meet greater than or equal to 90% of their needs  MONITOR:   PO intake, Supplement acceptance  REASON FOR ASSESSMENT:   Malnutrition Screening Tool    ASSESSMENT:   88 y.o. female admits related to fevers, chills and dysuria. PMH includes: T2DM, HTN, IDA, IBS, breast cancer s/p lumpectomy.  Meds include: sliding scale insulin. Labs reviewed: BUN/Crt elevated; FS Glucose: 149-261 mg/dL.   RD attempted to see pt 2x. Pt being seen by other provider and using the bathroom at both attempts. RD was able to view lunch tray and it appears that the pt ate about 50% or less. RD will add Glucerna BID for now to help pt meet her needs. Will continue to monitor PO intakes.   NUTRITION - FOCUSED PHYSICAL EXAM:  Pt being seen by another provider 2 x. Will attempt at follow up.   Diet Order:   Diet Order             Diet Carb Modified Fluid consistency: Thin; Room service appropriate? Yes  Diet effective now                   EDUCATION NEEDS:   Not appropriate for education at this time  Skin:  Skin Assessment: Reviewed RN Assessment  Last BM:  03/22/22  Height:   Ht Readings from Last 1 Encounters:  03/21/22 '5\' 1"'$  (1.549 m)    Weight:   Wt Readings from Last 1 Encounters:  03/21/22 51.9 kg    Ideal Body Weight:  47.7 kg  BMI:  Body mass index is 21.62 kg/m.  Estimated Nutritional Needs:   Kcal:  1300-1560 kcals  Protein:  65-80 gm  Fluid:  >/= 1.3 L  Thalia Bloodgood, RD, LDN, CNSC

## 2022-03-22 NOTE — Progress Notes (Signed)
Mobility Specialist - Progress Note   03/22/22 1400  Mobility  Activity Ambulated with assistance to bathroom  Level of Assistance Minimal assist, patient does 75% or more  Assistive Device Front wheel walker  Distance Ambulated (ft) 10 ft  Activity Response Tolerated well  $Mobility charge 1 Mobility    Pt received in bed agreeable to mobility. Had BM in BR and assisted back to bed. Left w/ call bell and all needs met.     Mobility Specialist  

## 2022-03-22 NOTE — Hospital Course (Addendum)
Sara Reilly is a 88 y.o. female with past medical history of type 2 diabetes, hypertension, IBS, previous breast cancer status post  lumpectomy presented to hospital with fever chills and dysuria with decreased appetite fatigue and generalized weakness.  Patient was evaluated by EMS daily over the last 2 days.  She initially did not want to come to hospital, and her PCP prescribed her an antibiotic cefpodoxime 200 mg po daily.  Despite that her symptoms persisted so patient was brought into the hospital.  EMS noted temperature of 100.7 F.  In the ED patient was noted to have mild hypotension with blood pressure of 90/67.  WBC was elevated at 20.3.  Lactate at 1.1.  COVID and influenza was negative.  EKG showed normal sinus rhythm.  Patient received Ringer lactate x1 and Rocephin and was admitted hospital for further evaluation and treatment

## 2022-03-22 NOTE — Progress Notes (Addendum)
PROGRESS NOTE    [name]  MWU:[id number] DOB: [birth year] DOA: 03/21/2022 PCP: Rogers Blocker, MD    Brief Narrative:  Sara Reilly is a 88 y.o. female with past medical history of type 2 diabetes, hypertension, IBS, previous breast cancer status post  lumpectomy presented to hospital with fever chills and dysuria with decreased appetite fatigue and generalized weakness.  Patient was evaluated by EMS daily over the last 2 days.  She initially did not want to come to hospital, and her PCP prescribed her an antibiotic cefpodoxime 200 mg po daily.  Despite that her symptoms persisted so patient was brought into the hospital.  EMS noted temperature of 100.7 F.  In the ED, patient was noted to have mild hypotension with blood pressure of 90/67.  WBC was elevated at 20.3.  Lactate at 1.1.  COVID and influenza was negative.  EKG showed normal sinus rhythm.  Patient received Ringer lactate IV, Rocephin and was admitted hospital for further evaluation and treatment   Assessment and plan  Principal Problem:   Sepsis ([place]) Active Problems:   Pyelonephritis of left kidney   Sepsis secondary to UTI ([place])   Paroxysmal atrial fibrillation with RVR ([facility])   T2DM (type 2 diabetes mellitus) ([place])   HTN (hypertension)   IBS (irritable bowel syndrome)   IDA (iron deficiency anemia)   History of breast cancer   Sepsis secondary to acute pyelonephritis.   Patient had fever and leukocytosis on presentation with abnormal urinalysis.  Showing signs of pyelonephritis.  Blood cultures negative in less than 24 hours.  Urinalysis showed leukocytes and white cells more than 50.  Influenza and COVID was negative.  WBC was 20.3 on presentation.  We will continue to monitor clinically.  Check CBC in AM.  Acute left pyelonephritis  CT scan showed left pyelonephritis with no evidence of obstructive uropathy. continue Rocephin IV.  Previous urine cultures with pansensitive Klebsiella.  Urine culture so far with no  growth.  Diabetes mellitus type 2. Continue sliding scale insulin Accu-Cheks diabetic diet.  Check hemoglobin A1c. Seen by diabetic coordinator.  Essential hypertension. Continue metoprolol.  History of irritable bowel syndrome.  Stable at this time.  History of iron deficiency anemia.   No active bleed.  Hemoglobin of 8.1 this admission.  Hemoglobin was 10-11 in 2018.  History of breast cancer status postlumpectomy.  Currently stable.   Debility weakness.  We will get PT evaluation.     DVT prophylaxis: enoxaparin (LOVENOX) injection 30 mg Start: 03/21/22 1000   Code Status:     Code Status: Full Code  Disposition: Home likely in 1 to 2 days Status is: Inpatient Remains inpatient appropriate because: IV antibiotic, sepsis   Family Communication: None at bedside.  Consultants:  None  Procedures:  None  Antimicrobials:  Rocephin IV  Anti-infectives (From admission, onward)    Start     Dose/Rate Route Frequency Ordered Stop   03/22/22 0800  cefTRIAXone (ROCEPHIN) 2 g in sodium chloride 0.9 % 100 mL IVPB        2 g 200 mL/hr over 30 Minutes Intravenous Every 24 hours 03/21/22 0758 03/29/22 0759   03/21/22 0715  cefTRIAXone (ROCEPHIN) 2 g in sodium chloride 0.9 % 100 mL IVPB        2 g 200 mL/hr over 30 Minutes Intravenous  Once 03/21/22 0712 03/21/22 0803      Subjective: Today, patient was seen and examined at bedside.  Feels a little better than yesterday.  Denies any nausea vomiting fever or chills.  Has mild tenderness in the back.  Denies urinary urgency frequency.  Objective: Vitals:   03/21/22 1727 03/21/22 2207 03/22/22 0626 03/22/22 0751  BP: (!) 99/45 (!) 97/50 (!) 124/55 (!) 116/43  Pulse: 66 75 69 68  Resp: '18 18 20 16  '$ Temp: (!) 97.5 F (36.4 C) 97.6 F (36.4 C) 98.2 F (36.8 C) 98.4 F (36.9 C)  TempSrc: Oral Oral Oral Oral  SpO2: 95% 100% 100% 100%  Weight:      Height:        Intake/Output Summary (Last 24 hours) at 03/22/2022  1308 Last data filed at 03/21/2022 2058 Gross per 24 hour  Intake --  Output 100 ml  Net -100 ml   Filed Weights   03/21/22 0616  Weight: 51.9 kg    Physical Examination: Body mass index is 21.62 kg/m.  General:  Average built, not in obvious distress elderly female, HENT:   No scleral pallor or icterus noted. Oral mucosa is moist.  Chest:  Clear breath sounds.  Diminished breath sounds bilaterally. No crackles or wheezes.  CVS: S1 &S2 heard. No murmur.  Regular rate and rhythm. Abdomen: Soft, nontender, nondistended.  Bowel sounds are heard.  Tenderness over the left costovertebral angle. Extremities: No cyanosis, clubbing or edema.  Peripheral pulses are palpable. Psych: Alert, awake and oriented, normal mood CNS:  No cranial nerve deficits.  Power equal in all extremities.   Skin: Warm and dry.  No rashes noted.  Data Reviewed:   CBC: Recent Labs  Lab 03/21/22 0314 03/22/22 0259  WBC 20.3* 11.1*  NEUTROABS 17.3*  --   HGB 8.0* 7.3*  HCT 26.9* 24.5*  MCV 111.2* 110.9*  PLT 270 242    Basic Metabolic Panel: Recent Labs  Lab 03/21/22 0314 03/22/22 0259  NA 139 140  K 3.9 4.0  CL 104 107  CO2 21* 24  GLUCOSE 162* 239*  BUN 23 26*  CREATININE 1.17* 1.25*  CALCIUM 8.9 8.5*    Liver Function Tests: Recent Labs  Lab 03/21/22 0314  AST 22  ALT 14  ALKPHOS 43  BILITOT 0.7  PROT 6.0*  ALBUMIN 2.5*     Radiology Studies: CT ABDOMEN PELVIS W CONTRAST  Result Date: 03/21/2022 CLINICAL DATA:  Left lower quadrant pain EXAM: CT ABDOMEN AND PELVIS WITH CONTRAST TECHNIQUE: Multidetector CT imaging of the abdomen and pelvis was performed using the standard protocol following bolus administration of intravenous contrast. RADIATION DOSE REDUCTION: This exam was performed according to the departmental dose-optimization program which includes automated exposure control, adjustment of the mA and/or kV according to patient size and/or use of iterative reconstruction  technique. CONTRAST:  65m OMNIPAQUE IOHEXOL 350 MG/ML SOLN COMPARISON:  12/27/2021. FINDINGS: Lower chest: Scarring at the lung bases, stable. History of lumpectomy with scar-like appearance in the medial and lower right breast. Atheromatous calcification of the coronary arteries. Hepatobiliary: No focal liver abnormality.No evidence of biliary obstruction or stone. Pancreas: No acute finding. Cystic density in the pancreatic head measuring 18 x 9 mm, unchanged. Follow-up recommendations were previously provided. Spleen: Unremarkable. Adrenals/Urinary Tract: Negative adrenals. New hypoenhancement in the posterior aspect of the interpolar left kidney with possible urothelial thickening at the left renal pelvis. The regional fat is stranded. Findings most consistent with pyelonephritis given the appearance and rapid development. No discrete fluid collection although some liquefaction is possible due to the degree of central low-density. Unremarkable bladder. Stomach/Bowel: No obstruction. Numerous in generalized colonic diverticulosis.  No visible bowel inflammation Vascular/Lymphatic: Extensive atheromatous plaque affecting the aorta and branch vessels with bilateral inflow stenoses. No mass or adenopathy. Reproductive:No pathologic findings. Other: No ascites or pneumoperitoneum. Musculoskeletal: Advanced spinal degeneration with mild scoliosis and L4-5 anterolisthesis. Advanced L4-5 spinal stenosis. Multilevel lumbar foraminal impingement. IMPRESSION: Findings of pyelonephritis on the left. Mild liquefaction may be present but no abscess or hydronephrosis. Electronically Signed   By: Jorje Guild M.D.   On: 03/21/2022 07:02   DG Chest 1 View  Result Date: 03/21/2022 CLINICAL DATA:  Sepsis EXAM: CHEST  1 VIEW COMPARISON:  03/10/2021 FINDINGS: Heart is normal size. Aortic atherosclerosis. Right basilar scarring, unchanged. No confluent airspace opacities or effusions. No acute bony abnormality. IMPRESSION: No  active disease. Electronically Signed   By: Rolm Baptise M.D.   On: 03/21/2022 03:48      LOS: 1 day    Flora Lipps, MD [facility] Available via Epic secure chat 7am-7pm After these hours, please refer to coverage provider listed on [email or web address] 03/22/2022, 1:08 PM

## 2022-03-22 NOTE — Inpatient Diabetes Management (Signed)
Inpatient Diabetes Program Recommendations  AACE/ADA: New Consensus Statement on Inpatient Glycemic Control   Target Ranges:  Prepandial:   less than 140 mg/dL      Peak postprandial:   less than 180 mg/dL (1-2 hours)      Critically ill patients:  140 - 180 mg/dL     Latest Reference Range & Units 03/22/22 02:59  Hemoglobin A1C 4.8 - 5.6 % 5.7 (H)    Latest Reference Range & Units 03/21/22 12:19 03/21/22 17:27 03/21/22 22:08 03/22/22 07:53  Glucose-Capillary 70 - 99 mg/dL 149 (H) 198 (H) 261 (H) 163 (H)   Review of Glycemic Control  Diabetes history: DM2 Diabetes medications: None Current orders for Inpatient glycemic control: None; CBGs  Inpatient Diabetes Program Recommendations:    Insulin: While inpatient, please consider ordering Novolog 0-6 units AC&HS.  Thanks, Barnie Alderman, RN, MSN, [place] Coordinator Inpatient Diabetes Program [phone] (Team Pager from 8am to [place])

## 2022-03-23 DIAGNOSIS — Z853 Personal history of malignant neoplasm of breast: Secondary | ICD-10-CM | POA: Diagnosis not present

## 2022-03-23 DIAGNOSIS — A419 Sepsis, unspecified organism: Secondary | ICD-10-CM | POA: Diagnosis not present

## 2022-03-23 DIAGNOSIS — N12 Tubulo-interstitial nephritis, not specified as acute or chronic: Secondary | ICD-10-CM | POA: Diagnosis not present

## 2022-03-23 DIAGNOSIS — I10 Essential (primary) hypertension: Secondary | ICD-10-CM | POA: Diagnosis not present

## 2022-03-23 LAB — CBC
HCT: 25.2 % — ABNORMAL LOW (ref 36.0–46.0)
Hemoglobin: 7.4 g/dL — ABNORMAL LOW (ref 12.0–15.0)
MCH: 32.7 pg (ref 26.0–34.0)
MCHC: 29.4 g/dL — ABNORMAL LOW (ref 30.0–36.0)
MCV: 111.5 fL — ABNORMAL HIGH (ref 80.0–100.0)
Platelets: 261 10*3/uL (ref 150–400)
RBC: 2.26 MIL/uL — ABNORMAL LOW (ref 3.87–5.11)
RDW: 13.8 % (ref 11.5–15.5)
WBC: 8.2 10*3/uL (ref 4.0–10.5)
nRBC: 0 % (ref 0.0–0.2)

## 2022-03-23 LAB — BASIC METABOLIC PANEL
Anion gap: 7 (ref 5–15)
BUN: 20 mg/dL (ref 8–23)
CO2: 26 mmol/L (ref 22–32)
Calcium: 8.1 mg/dL — ABNORMAL LOW (ref 8.9–10.3)
Chloride: 106 mmol/L (ref 98–111)
Creatinine, Ser: 1.11 mg/dL — ABNORMAL HIGH (ref 0.44–1.00)
GFR, Estimated: 47 mL/min — ABNORMAL LOW (ref >60)
Glucose, Bld: 151 mg/dL — ABNORMAL HIGH (ref 70–99)
Potassium: 4.1 mmol/L (ref 3.5–5.1)
Sodium: 139 mmol/L (ref 135–145)

## 2022-03-23 LAB — GLUCOSE, CAPILLARY
Glucose-Capillary: 131 mg/dL — ABNORMAL HIGH (ref 70–99)
Glucose-Capillary: 186 mg/dL — ABNORMAL HIGH (ref 70–99)
Glucose-Capillary: 193 mg/dL — ABNORMAL HIGH (ref 70–99)
Glucose-Capillary: 171 mg/dL — ABNORMAL HIGH (ref 70–99)

## 2022-03-23 LAB — MAGNESIUM: Magnesium: 1.9 mg/dL (ref 1.7–2.4)

## 2022-03-23 NOTE — Progress Notes (Signed)
Physical Therapy Evaluation Patient Details Name: Sara Reilly MRN: [id number] DOB: 1929/11/10 Today's Date: 03/23/2022  History of Present Illness  88 yo female with onset of weakness and fatigue, fever, chills, dysuria, was brought to hosp on 11/1, found to have sepsis from pyelonephritis and UTI.  PMHx:  breast CA, DM, diverticulosis, fecal incontinence, HTN, IBS, atherosclerosis, R lung scarring,  Clinical Impression  Pt was seen for managing mobility from bed level, and was successful to get up to chair and then to BR.  Pt is motivated to be more independent and has asked for more walking and to be able to assist herself to do self care as permitted.  Follow for plan to send to SNF for strengthening and for follow up thereafter at home with family support.  Pt has acute goals with plan to increase strength, balance and quality of gait for same objectives.       Recommendations for follow up therapy are one component of a multi-disciplinary discharge planning process, led by the attending physician.  Recommendations may be updated based on patient status, additional functional criteria and insurance authorization.  Follow Up Recommendations Skilled nursing-short term rehab (<3 hours/day) Can patient physically be transported by private vehicle: No    Assistance Recommended at Discharge Frequent or constant Supervision/Assistance  Patient can return home with the following  A lot of help with walking and/or transfers;A little help with bathing/dressing/bathroom;Assistance with cooking/housework;Direct supervision/assist for medications management;Direct supervision/assist for financial management;Assist for transportation;Help with stairs or ramp for entrance    Equipment Recommendations None recommended by PT  Recommendations for Other Services       Functional Status Assessment Patient has had a recent decline in their functional status and demonstrates the ability to make significant  improvements in function in a reasonable and predictable amount of time.     Precautions / Restrictions Precautions Precautions: Fall Precaution Comments: monitor vitals Restrictions Weight Bearing Restrictions: No      Mobility  Bed Mobility Overal bed mobility: Needs Assistance             General bed mobility comments: PT arrived as pt was already on side of bed    Transfers Overall transfer level: Needs assistance Equipment used: Rolling walker (2 wheels), 1 person hand held assist Transfers: Sit to/from Stand Sit to Stand: Mod assist           General transfer comment: mod support to power up and min to steady    Ambulation/Gait Ambulation/Gait assistance: Min assist Gait Distance (Feet): 35 Feet (9+37+16) Assistive device: Rolling walker (2 wheels) Gait Pattern/deviations: Step-through pattern, Decreased stride length, Wide base of support Gait velocity: reduced Gait velocity interpretation: <1.31 ft/sec, indicative of household ambulator   General Gait Details: slow pace with RW and safety cues from PT for turns and set up to sit every trial  Stairs            Wheelchair Mobility    Modified Rankin (Stroke Patients Only)       Balance Overall balance assessment: Needs assistance Sitting-balance support: Feet supported Sitting balance-Leahy Scale: Fair     Standing balance support: Bilateral upper extremity supported, During functional activity Standing balance-Leahy Scale: Poor                               Pertinent Vitals/Pain Pain Assessment Pain Assessment: Faces Faces Pain Scale: No hurt    Home Living Family/patient expects  to be discharged to:: Private residence Living Arrangements: Children Available Help at Discharge: Family;Available 24 hours/day Type of Home: House Home Access: Level entry     Alternate Level Stairs-Number of Steps: 16 Home Layout: Two level;Able to live on main level with  bedroom/bathroom Home Equipment: Rollator (4 wheels);Cane - single point;Grab bars - toilet;Grab bars - tub/shower Additional Comments: family and caregiver for 3x a week in AM for four hours    Prior Function Prior Level of Function : Needs assist       Physical Assist : Mobility (physical) Mobility (physical): Gait   Mobility Comments: rollator for gait ADLs Comments: family helps with bathing and dressing     Hand Dominance   Dominant Hand: Right    Extremity/Trunk Assessment   Upper Extremity Assessment Upper Extremity Assessment: Generalized weakness    Lower Extremity Assessment Lower Extremity Assessment: Generalized weakness    Cervical / Trunk Assessment Cervical / Trunk Assessment: Kyphotic  Communication   Communication: No difficulties  Cognition Arousal/Alertness: Awake/alert Behavior During Therapy: WFL for tasks assessed/performed Overall Cognitive Status: No family/caregiver present to determine baseline cognitive functioning                                 General Comments: has fairly good history with some hesitations in information        General Comments General comments (skin integrity, edema, etc.): pt is up to walk in three short trips with RW and cues for safety every walk    Exercises     Assessment/Plan    PT Assessment Patient needs continued PT services  PT Problem List Decreased strength;Decreased range of motion;Decreased activity tolerance;Decreased balance;Decreased mobility;Decreased coordination;Decreased cognition;Decreased knowledge of use of DME;Decreased safety awareness       PT Treatment Interventions DME instruction;Gait training;Stair training;Functional mobility training;Therapeutic activities;Therapeutic exercise;Balance training;Neuromuscular re-education;Cognitive remediation;Patient/family education;Wheelchair mobility training;Manual techniques;Modalities    PT Goals (Current goals can be found in  the Care Plan section)  Acute Rehab PT Goals Patient Stated Goal: to go home with caregivers and family PT Goal Formulation: With patient Time For Goal Achievement: 04/06/22 Potential to Achieve Goals: Good    Frequency Min 3X/week     Co-evaluation               AM-PAC PT "6 Clicks" Mobility  Outcome Measure Help needed turning from your back to your side while in a flat bed without using bedrails?: A Little Help needed moving from lying on your back to sitting on the side of a flat bed without using bedrails?: A Little Help needed moving to and from a bed to a chair (including a wheelchair)?: A Lot Help needed standing up from a chair using your arms (e.g., wheelchair or bedside chair)?: A Lot Help needed to walk in hospital room?: A Little Help needed climbing 3-5 steps with a railing? : Total 6 Click Score: 14    End of Session Equipment Utilized During Treatment: Gait belt Activity Tolerance: Patient limited by fatigue;Treatment limited secondary to medical complications (Comment) Patient left: in chair;with call bell/phone within reach;with chair alarm set Nurse Communication: Mobility status PT Visit Diagnosis: Unsteadiness on feet (R26.81);Muscle weakness (generalized) (M62.81);Difficulty in walking, not elsewhere classified (R26.2)    Time: 2426-8341 PT Time Calculation (min) (ACUTE ONLY): 31 min   Charges:   PT Evaluation $PT Eval Moderate Complexity: 1 Mod PT Treatments $Gait Training: 8-22 mins  Ramond Dial 03/23/2022, 4:41 PM  Mee Hives, PT PhD Acute Rehab Dept. Number: [facility] [facility] [place]

## 2022-03-23 NOTE — Progress Notes (Signed)
PROGRESS NOTE    Sara Reilly  TUU:[id number] DOB: 02-Jan-1930 DOA: 03/21/2022 PCP: Rogers Blocker, MD    Brief Narrative:  Sara Reilly is a 88 y.o. female with past medical history of type 2 diabetes, hypertension, IBS, previous breast cancer status post lumpectomy presented to the hospital with fever, chills and dysuria with decreased appetite, fatigue and generalized weakness.  Patient was evaluated by EMS daily over the last 2 days.  She initially did not want to come to hospital, and her PCP prescribed her an antibiotic cefpodoxime 200 mg po daily.  Despite that her symptoms persisted so patient was brought into the hospital.  EMS noted temperature of 100.7 F.  In the ED, patient was noted to have mild hypotension with blood pressure of 90/67.  WBC was elevated at 20.3.  Lactate at 1.1.  COVID and influenza was negative.  CT scan of the abdomen and pelvis with findings of left pyelonephritis.  EKG showed normal sinus rhythm.  Patient received Ringer lactate IV, Rocephin and was admitted hospital for further evaluation and treatment   Assessment and plan  Principal Problem:   Sepsis ([place]) Active Problems:   Pyelonephritis of left kidney   Sepsis secondary to UTI ([place])   Paroxysmal atrial fibrillation with RVR ([facility])   T2DM (type 2 diabetes mellitus) ([place])   HTN (hypertension)   IBS (irritable bowel syndrome)   IDA (iron deficiency anemia)   History of breast cancer   Sepsis secondary to acute left pyelonephritis.   Patient had fever and leukocytosis on presentation with abnormal urinalysis.  CT scan abdomen with signs of pyelonephritis.  Blood cultures negative in less than 24 hours.  Urinalysis showed leukocytes and white cells more than 50.  Influenza and COVID was negative.  WBC was 20.3 on presentation and has trended down to 8.2 today.  Temperature max of 98.7 Fahrenheit..  Continue Rocephin for now.  Still has costovertebral angle tenderness.  Acute left pyelonephritis  CT scan  showed left pyelonephritis with no evidence of obstructive uropathy. continue Rocephin IV.  Previous urine cultures with pansensitive Klebsiella.  Urine culture so far with no growth.  Blood cultures negative less than 24 hours.  Diabetes mellitus type 2. Continue sliding scale insulin Accu-Cheks diabetic diet.  Check hemoglobin A1c. Seen by diabetic coordinator.  Essential hypertension. Continue metoprolol.  History of irritable bowel syndrome.  Stable at this time.  History of iron deficiency anemia.   No report of any bleeding.  Hemoglobin of 7.4 this admission.  Hemoglobin was 10-11 in 2018.  History of breast cancer status postlumpectomy.  Currently stable.   Debility, weakness.  We will get PT evaluation.  Pending.  Patient does have a rolling walker and cane at home.     DVT prophylaxis: enoxaparin (LOVENOX) injection 30 mg Start: 03/21/22 1000   Code Status:     Code Status: Full Code  Disposition: Home likely on 03/24/2022 if clinically improved, awaiting PT evaluation.  Status is: Inpatient Remains inpatient appropriate because: IV antibiotic for acute pyelonephritis,, sepsis, follow cultures.   Family Communication:  Spoke with the patient's daughter at bedside  Consultants:  None  Procedures:  None  Antimicrobials:  Rocephin IV 03/21/22>  Anti-infectives (From admission, onward)    Start     Dose/Rate Route Frequency Ordered Stop   03/22/22 0800  cefTRIAXone (ROCEPHIN) 2 g in sodium chloride 0.9 % 100 mL IVPB        2 g 200 mL/hr over 30 Minutes  Intravenous Every 24 hours 03/21/22 0758 03/29/22 0759   03/21/22 0715  cefTRIAXone (ROCEPHIN) 2 g in sodium chloride 0.9 % 100 mL IVPB        2 g 200 mL/hr over 30 Minutes Intravenous  Once 03/21/22 6734 03/21/22 0803      Subjective: Today, patient was seen and examined at bedside.  Still complains of left back pain.  No nausea vomiting fever chills or rigor.  Patient's daughter at bedside.  Appears to be more  alert awake and Communicative as per the daughter patient   Objective: Vitals:   03/22/22 1707 03/22/22 2113 03/23/22 0626 03/23/22 0801  BP: (!) 129/52 (!) 140/60 (!) 112/42 (!) 126/48  Pulse: 66 68 70 70  Resp: '17 16 16 16  '$ Temp: 98 F (36.7 C) 98.2 F (36.8 C) 98.7 F (37.1 C) 98.2 F (36.8 C)  TempSrc: Oral Oral Oral Oral  SpO2: 100% 100% 98% 99%  Weight:      Height:        Intake/Output Summary (Last 24 hours) at 03/23/2022 0829 Last data filed at 03/22/2022 1532 Gross per 24 hour  Intake 100 ml  Output --  Net 100 ml    Filed Weights   03/21/22 0616  Weight: 51.9 kg    Physical Examination: Body mass index is 21.62 kg/m.   General:  Average built, not in obvious distress alert awake, elderly female, HENT:   No scleral pallor or icterus noted. Oral mucosa is moist.  Chest:  Clear breath sounds.  Diminished breath sounds bilaterally. No crackles or wheezes.  CVS: S1 &S2 heard. No murmur.  Regular rate and rhythm. Abdomen: Soft,  nondistended.  Bowel sounds are heard.  Left costovertebral angle tenderness++ Extremities: No cyanosis, clubbing or edema.  Peripheral pulses are palpable. Psych: Alert, awake and Communicative, normal mood CNS:  No cranial nerve deficits.  Power equal in all extremities.   Skin: Warm and dry.  No rashes noted.   Data Reviewed:   CBC: Recent Labs  Lab 03/21/22 0314 03/22/22 0259 03/23/22 0210  WBC 20.3* 11.1* 8.2  NEUTROABS 17.3*  --   --   HGB 8.0* 7.3* 7.4*  HCT 26.9* 24.5* 25.2*  MCV 111.2* 110.9* 111.5*  PLT 270 255 261     Basic Metabolic Panel: Recent Labs  Lab 03/21/22 0314 03/22/22 0259 03/23/22 0210  NA 139 140 139  K 3.9 4.0 4.1  CL 104 107 106  CO2 21* 24 26  GLUCOSE 162* 239* 151*  BUN 23 26* 20  CREATININE 1.17* 1.25* 1.11*  CALCIUM 8.9 8.5* 8.1*  MG  --   --  1.9     Liver Function Tests: Recent Labs  Lab 03/21/22 0314  AST 22  ALT 14  ALKPHOS 43  BILITOT 0.7  PROT 6.0*  ALBUMIN 2.5*       Radiology Studies: No results found.    LOS: 2 days    Flora Lipps, MD [facility] Available via Epic secure chat 7am-7pm After these hours, please refer to coverage provider listed on [email or web address] 03/23/2022, 8:29 AM

## 2022-03-23 NOTE — Progress Notes (Signed)
Mobility Specialist - Progress Note   03/23/22 1500  Mobility  Activity Transferred from bed to chair  Level of Assistance Contact guard assist, steadying assist  Assistive Device Front wheel walker  Distance Ambulated (ft) 2 ft  Activity Response Tolerated well  $Mobility charge 1 Mobility    Pt received in bed agreeable to mobility. Left in recliner w/ PT present and all needs met.   Paulla Dolly Mobility Specialist

## 2022-03-23 NOTE — Plan of Care (Signed)
  Problem: Education: Goal: Knowledge of General Education information will improve Description Including pain rating scale, medication(s)/side effects and non-pharmacologic comfort measures Outcome: Progressing   Problem: Health Behavior/Discharge Planning: Goal: Ability to manage health-related needs will improve Outcome: Progressing   

## 2022-03-24 DIAGNOSIS — N12 Tubulo-interstitial nephritis, not specified as acute or chronic: Secondary | ICD-10-CM | POA: Diagnosis not present

## 2022-03-24 DIAGNOSIS — Z853 Personal history of malignant neoplasm of breast: Secondary | ICD-10-CM | POA: Diagnosis not present

## 2022-03-24 DIAGNOSIS — A419 Sepsis, unspecified organism: Secondary | ICD-10-CM | POA: Diagnosis not present

## 2022-03-24 DIAGNOSIS — I10 Essential (primary) hypertension: Secondary | ICD-10-CM | POA: Diagnosis not present

## 2022-03-24 LAB — CBC
HCT: 25 % — ABNORMAL LOW (ref 36.0–46.0)
Hemoglobin: 7.5 g/dL — ABNORMAL LOW (ref 12.0–15.0)
MCH: 32.9 pg (ref 26.0–34.0)
MCHC: 30 g/dL (ref 30.0–36.0)
MCV: 109.6 fL — ABNORMAL HIGH (ref 80.0–100.0)
Platelets: 273 10*3/uL (ref 150–400)
RBC: 2.28 MIL/uL — ABNORMAL LOW (ref 3.87–5.11)
RDW: 13.8 % (ref 11.5–15.5)
WBC: 8 10*3/uL (ref 4.0–10.5)
nRBC: 0 % (ref 0.0–0.2)

## 2022-03-24 LAB — GLUCOSE, CAPILLARY
Glucose-Capillary: 132 mg/dL — ABNORMAL HIGH (ref 70–99)
Glucose-Capillary: 188 mg/dL — ABNORMAL HIGH (ref 70–99)
Glucose-Capillary: 240 mg/dL — ABNORMAL HIGH (ref 70–99)
Glucose-Capillary: 127 mg/dL — ABNORMAL HIGH (ref 70–99)

## 2022-03-24 LAB — BASIC METABOLIC PANEL
Anion gap: 4 — ABNORMAL LOW (ref 5–15)
BUN: 16 mg/dL (ref 8–23)
CO2: 27 mmol/L (ref 22–32)
Calcium: 8.3 mg/dL — ABNORMAL LOW (ref 8.9–10.3)
Chloride: 107 mmol/L (ref 98–111)
Creatinine, Ser: 1.04 mg/dL — ABNORMAL HIGH (ref 0.44–1.00)
GFR, Estimated: 51 mL/min — ABNORMAL LOW (ref >60)
Glucose, Bld: 176 mg/dL — ABNORMAL HIGH (ref 70–99)
Potassium: 4.4 mmol/L (ref 3.5–5.1)
Sodium: 138 mmol/L (ref 135–145)

## 2022-03-24 LAB — MAGNESIUM: Magnesium: 1.9 mg/dL (ref 1.7–2.4)

## 2022-03-24 NOTE — Progress Notes (Signed)
PROGRESS NOTE    Sara Reilly  LPF:[id number] DOB: [birth year] DOA: 03/21/2022 PCP: Rogers Blocker, MD    Brief Narrative:  Sara Reilly is a 88 y.o. female with past medical history of type 2 diabetes, hypertension, IBS, previous breast cancer status post lumpectomy presented to the hospital with fever, chills and dysuria with decreased appetite, fatigue and generalized weakness.  Patient was evaluated by EMS daily over the last 2 days.  She initially did not want to come to hospital, and her PCP prescribed her an antibiotic cefpodoxime 200 mg po daily.  Despite that her symptoms persisted so patient was brought into the hospital.  EMS noted temperature of 100.7 F.  In the ED, patient was noted to have mild hypotension with blood pressure of 90/67.  WBC was elevated at 20.3.  Lactate at 1.1.  COVID and influenza was negative.  CT scan of the abdomen and pelvis with findings of left pyelonephritis.  EKG showed normal sinus rhythm.  Patient received Ringer lactate IV, Rocephin and was admitted hospital for further evaluation and treatment   Assessment and plan  Principal Problem:   Sepsis ([place]) Active Problems:   Pyelonephritis of left kidney   Sepsis secondary to UTI ([place])   Paroxysmal atrial fibrillation with RVR (HCC)   T2DM (type 2 diabetes mellitus) ([place])   HTN (hypertension)   IBS (irritable bowel syndrome)   IDA (iron deficiency anemia)   History of breast cancer   Sepsis secondary to acute left pyelonephritis.   Patient had fever and leukocytosis on presentation with abnormal urinalysis.  CT scan abdomen showed signs of pyelonephritis.  Blood cultures negative in 3 days.  Urinalysis showed leukocytes and white cells more than 50.  Influenza and COVID was negative.  WBC was 20.3 on presentation and has trended down to 8.0 today.  Temperature max of 98.7 Fahrenheit..  Continue Rocephin for now.    Acute left pyelonephritis  CT scan showed left pyelonephritis with no evidence of  obstructive uropathy. continue Rocephin IV.  Previous urine cultures with pansensitive Klebsiella.  Urine culture and Blood cultures negative so far.  Diabetes mellitus type 2. Continue sliding scale insulin, Accu-Cheks diabetic diet.  Hhemoglobin A1c with 5.7. Seen by diabetic coordinator.  Essential hypertension. Continue metoprolol.  History of irritable bowel syndrome.  Stable at this time.  History of iron deficiency anemia.   No report of any bleeding.  Hemoglobin of 7.5.  Hemoglobin was 10-11 in 2018.  History of breast cancer status postlumpectomy.  Currently stable.   Debility, weakness.   PT evaluation recommended skilled nursing facility at this time. Patient does have a rolling walker and cane at home.     DVT prophylaxis: enoxaparin (LOVENOX) injection 30 mg Start: 03/21/22 1000   Code Status:     Code Status: Full Code  Disposition:  SNF as per PT evaluation.  Status is: Inpatient  Remains inpatient appropriate because: IV antibiotic for acute pyelonephritis,, sepsis, follow cultures.   Family Communication:  Spoke with the patient's daughter at bedside on 03/23/22  Consultants:  None  Procedures:  None  Antimicrobials:  Rocephin IV 03/21/22>  Anti-infectives (From admission, onward)    Start     Dose/Rate Route Frequency Ordered Stop   03/22/22 0800  cefTRIAXone (ROCEPHIN) 2 g in sodium chloride 0.9 % 100 mL IVPB        2 g 200 mL/hr over 30 Minutes Intravenous Every 24 hours 03/21/22 0758 03/29/22 0759   03/21/22 0715  cefTRIAXone (ROCEPHIN) 2 g in sodium chloride 0.9 % 100 mL IVPB        2 g 200 mL/hr over 30 Minutes Intravenous  Once 03/21/22 0712 03/21/22 0803      Subjective: Today, patient was seen and examined at bedside patient denies any nausea vomiting fever chills or rigor.  Denies much pain today.   Objective: Vitals:   03/23/22 1551 03/23/22 2106 03/24/22 0509 03/24/22 0736  BP: (!) 133/57 (!) 156/74 (!) 125/52 (!) 125/47   Pulse: 71 73 70 72  Resp: 16   16  Temp: 97.9 F (36.6 C) 97.8 F (36.6 C) 99.1 F (37.3 C) 98.7 F (37.1 C)  TempSrc: Oral Oral Oral Oral  SpO2: 100% 100% 97% 100%  Weight:      Height:       No intake or output data in the 24 hours ending 03/24/22 0913  Filed Weights   03/21/22 0616  Weight: 51.9 kg    Physical Examination: Body mass index is 21.62 kg/m.   General:  Average built, not in obvious distress elderly female, HENT:   No scleral pallor or icterus noted. Oral mucosa is moist.  Chest:  Clear breath sounds.  Diminished breath sounds bilaterally. No crackles or wheezes.  CVS: S1 &S2 heard. No murmur.  Regular rate and rhythm. Abdomen: Soft, nontender, nondistended.  Bowel sounds are heard.  Mild left costovertebral angle tenderness Extremities: No cyanosis, clubbing or edema.  Peripheral pulses are palpable. Psych: Alert, awake and oriented, normal mood CNS:  No cranial nerve deficits.  Power equal in all extremities.   Skin: Warm and dry.  No rashes noted.  Data Reviewed:   CBC: Recent Labs  Lab 03/21/22 0314 03/22/22 0259 03/23/22 0210 03/24/22 0221  WBC 20.3* 11.1* 8.2 8.0  NEUTROABS 17.3*  --   --   --   HGB 8.0* 7.3* 7.4* 7.5*  HCT 26.9* 24.5* 25.2* 25.0*  MCV 111.2* 110.9* 111.5* 109.6*  PLT 270 255 261 273     Basic Metabolic Panel: Recent Labs  Lab 03/21/22 0314 03/22/22 0259 03/23/22 0210 03/24/22 0221  NA 139 140 139 138  K 3.9 4.0 4.1 4.4  CL 104 107 106 107  CO2 21* '24 26 27  '$ GLUCOSE 162* 239* 151* 176*  BUN 23 26* 20 16  CREATININE 1.17* 1.25* 1.11* 1.04*  CALCIUM 8.9 8.5* 8.1* 8.3*  MG  --   --  1.9 1.9     Liver Function Tests: Recent Labs  Lab 03/21/22 0314  AST 22  ALT 14  ALKPHOS 43  BILITOT 0.7  PROT 6.0*  ALBUMIN 2.5*      Radiology Studies: No results found.    LOS: 3 days    Flora Lipps, MD [facility] Available via Epic secure chat 7am-7pm After these hours, please refer to  coverage provider listed on [email or web address] 03/24/2022, 9:13 AM

## 2022-03-25 DIAGNOSIS — Z853 Personal history of malignant neoplasm of breast: Secondary | ICD-10-CM | POA: Diagnosis not present

## 2022-03-25 DIAGNOSIS — N12 Tubulo-interstitial nephritis, not specified as acute or chronic: Secondary | ICD-10-CM | POA: Diagnosis not present

## 2022-03-25 DIAGNOSIS — I10 Essential (primary) hypertension: Secondary | ICD-10-CM | POA: Diagnosis not present

## 2022-03-25 DIAGNOSIS — A419 Sepsis, unspecified organism: Secondary | ICD-10-CM | POA: Diagnosis not present

## 2022-03-25 LAB — GLUCOSE, CAPILLARY
Glucose-Capillary: 131 mg/dL — ABNORMAL HIGH (ref 70–99)
Glucose-Capillary: 201 mg/dL — ABNORMAL HIGH (ref 70–99)
Glucose-Capillary: 160 mg/dL — ABNORMAL HIGH (ref 70–99)
Glucose-Capillary: 128 mg/dL — ABNORMAL HIGH (ref 70–99)

## 2022-03-25 NOTE — Progress Notes (Signed)
Mobility Specialist Progress Note   03/25/22 1500  Mobility  Activity Ambulated with assistance in hallway;Ambulated with assistance to bathroom;Transferred from bed to chair  Level of Assistance Minimal assist, patient does 75% or more  Assistive Device Front wheel walker  Distance Ambulated (ft) 45 ft (10 + 20 + 15)  Range of Motion/Exercises Active;All extremities  Activity Response Tolerated well   Patient received in supine eager to participate. Required mod A to EOB from supine>sit  and min A + cues for hand placement to stand. Ambulated to bathroom for BM then in hallway with close min guard. Needed multiple seated rest breaks between short bouts of ambulation secondary to fatigue and SOB. Returned to room without complaint or incident. Was left in recliner with all needs met, call bell in reach.   [place] Dilan [place], [place], [place]  Office: [phone]

## 2022-03-25 NOTE — TOC Initial Note (Signed)
Transition of Care [facility]) - Initial/Assessment Note    Patient Details  Name: Sara Reilly MRN: [id number] Date of Birth: July 24, 1929  Transition of Care [facility]) CM/SW Contact:    Milas Gain, [place] Phone Number: 03/25/2022, 12:32 PM  Clinical Narrative:                  CSW received consult for possible SNF placement at time of discharge. CSW spoke with patient at bedside regarding PT recommendation of SNF placement at time of discharge.Patient reports she comes from home with daughter Crystal. Patient expressed understanding of PT recommendation and is agreeable to SNF placement at time of discharge. Patient gave CSW permission to fax out initial referral near the [place] area. CSW discussed insurance authorization process and will provide Medicare SNF ratings list with accepted bed offers when available. Patient reports she is Covid Vaccinated. No further questions reported at this time. CSW to continue to follow and assist with discharge planning needs.   Expected Discharge Plan: Skilled Nursing Facility Barriers to Discharge: Continued Medical Work up   Patient Goals and CMS Choice Patient states their goals for this hospitalization and ongoing recovery are:: SNF CMS Medicare.gov Compare Post Acute Care list provided to:: Patient Choice offered to / list presented to : Patient  Expected Discharge Plan and Services Expected Discharge Plan: [place] In-house Referral: Clinical Social Work     Living arrangements for the past 2 months: [place]                                      Prior Living Arrangements/Services Living arrangements for the past 2 months: Single Family Home Lives with:: Adult Children (Lives with daughter Teacher, music) Patient language and need for interpreter reviewed:: Yes Do you feel safe going back to the place where you live?: No   SNF  Need for Family Participation in Patient Care: Yes (Comment) Care giver support  system in place?: Yes (comment)   Criminal Activity/Legal Involvement Pertinent to Current Situation/Hospitalization: No - Comment as needed  Activities of Daily Living Home Assistive Devices/Equipment: Walker (specify type) ADL Screening (condition at time of admission) Patient's cognitive ability adequate to safely complete daily activities?: Yes Is the patient deaf or have difficulty hearing?: No Does the patient have difficulty seeing, even when wearing glasses/contacts?: Yes Does the patient have difficulty concentrating, remembering, or making decisions?: No Patient able to express need for assistance with ADLs?: Yes Does the patient have difficulty dressing or bathing?: No Independently performs ADLs?: Yes (appropriate for developmental age) Does the patient have difficulty walking or climbing stairs?: Yes Weakness of Legs: Both Weakness of Arms/Hands: None  Permission Sought/Granted Permission sought to share information with : Case Manager, Family Supports, Chartered certified accountant granted to share information with : Yes, Verbal Permission Granted  Share Information with NAME: Crystal and Roynald  Permission granted to share info w AGENCY: SNF  Permission granted to share info w Relationship: daughter and Son  Permission granted to share info w Contact Information: Donella Stade [phone] Roynald [phone]  Emotional Assessment Appearance:: Appears stated age Attitude/Demeanor/Rapport: Gracious Affect (typically observed): Calm Orientation: : Oriented to Self, Oriented to Place, Oriented to  Time, Oriented to Situation Alcohol / Substance Use: Not Applicable Psych Involvement: No (comment)  Admission diagnosis:  Sepsis [facility]) [A41.9] Patient Active Problem List   Diagnosis Date Noted   Sepsis ([place]) 03/21/2022  Pyelonephritis of left kidney 03/21/2022   Sepsis secondary to UTI ([place]) 03/21/2022   Paroxysmal atrial fibrillation with RVR ([place]) 03/21/2022    T2DM (type 2 diabetes mellitus) ([place]) 03/21/2022   HTN (hypertension) 03/21/2022   IBS (irritable bowel syndrome) 03/21/2022   IDA (iron deficiency anemia) 03/21/2022   History of breast cancer 03/21/2022   PCP:  Rogers Blocker, MD Pharmacy:   [id number] [id number] [id number]: [phone]: [phone]    Social Determinants of Health (SDOH) Interventions    Readmission Risk Interventions     No data to display

## 2022-03-25 NOTE — Progress Notes (Signed)
PROGRESS NOTE    Sara Reilly  GNO:[id number] DOB: 08-Mar-1930 DOA: 03/21/2022 PCP: Rogers Blocker, MD    Brief Narrative:  Sara Reilly is a 88 y.o. female with past medical history of type 2 diabetes, hypertension, IBS, previous breast cancer status post lumpectomy presented to the hospital with fever, chills and dysuria with decreased appetite, fatigue and generalized weakness.  Patient was evaluated by EMS daily over the last 2 days.  She initially did not want to come to hospital, and her PCP prescribed her an antibiotic cefpodoxime 200 mg po daily.  Despite that her symptoms persisted so patient was brought into the hospital.  EMS noted temperature of 100.7 F.  In the ED, patient was noted to have mild hypotension with blood pressure of 90/67.  WBC was elevated at 20.3.  Lactate at 1.1.  COVID and influenza was negative.  CT scan of the abdomen and pelvis with findings of left pyelonephritis.  EKG showed normal sinus rhythm.  Patient received Ringer lactate IV, Rocephin and was admitted hospital for further evaluation and treatment   Assessment and plan  Principal Problem:   Sepsis ([place]) Active Problems:   Pyelonephritis of left kidney   Sepsis secondary to UTI ([place])   Paroxysmal atrial fibrillation with RVR (HCC)   T2DM (type 2 diabetes mellitus) ([place])   HTN (hypertension)   IBS (irritable bowel syndrome)   IDA (iron deficiency anemia)   History of breast cancer   Sepsis secondary to acute left pyelonephritis.   Improved at this time.  Patient had fever and leukocytosis on presentation with abnormal urinalysis.  CT scan abdomen showed signs of pyelonephritis.  Blood cultures negative in 4 days.  Urinalysis showed leukocytes and white cells more than 50.  Influenza and COVID was negative.  WBC was 20.3 on presentation and has trended down to 8.0. Temperature max of 98.9 Fahrenheit..  Continue IV Rocephin for now.   CT scan showed left pyelonephritis with no evidence of obstructive  uropathy. Previous urine cultures with pansensitive Klebsiella.  Urine culture and Blood cultures negative so far.  Diabetes mellitus type 2. Continue sliding scale insulin, Accu-Cheks diabetic diet.  Hhemoglobin A1c with 5.7. Seen by diabetic coordinator.  Essential hypertension. Continue metoprolol.  Blood pressure seems to be stable  History of irritable bowel syndrome.  Stable at this time.  History of iron deficiency anemia.   No report of any bleeding.  Hemoglobin of 7.5.  Hemoglobin was 10-11 in 2018.  History of breast cancer status postlumpectomy.  Currently stable.   Debility, weakness.   PT evaluation recommended skilled nursing facility at this time. Patient does have a rolling walker and cane at home.     DVT prophylaxis: enoxaparin (LOVENOX) injection 30 mg Start: 03/21/22 1000   Code Status:     Code Status: Full Code  Disposition:  SNF as per PT evaluation.  Status is: Inpatient  Remains inpatient appropriate because: IV antibiotic for acute pyelonephritis, sepsis, follow cultures.   Family Communication:  Spoke with the patient's daughter at bedside on 03/23/22  Consultants:  None  Procedures:  None  Antimicrobials:  Rocephin IV 03/21/22>  Anti-infectives (From admission, onward)    Start     Dose/Rate Route Frequency Ordered Stop   03/22/22 0800  cefTRIAXone (ROCEPHIN) 2 g in sodium chloride 0.9 % 100 mL IVPB        2 g 200 mL/hr over 30 Minutes Intravenous Every 24 hours 03/21/22 0758 03/29/22 0759   03/21/22  0715  cefTRIAXone (ROCEPHIN) 2 g in sodium chloride 0.9 % 100 mL IVPB        2 g 200 mL/hr over 30 Minutes Intravenous  Once 03/21/22 0712 03/21/22 0803      Subjective: Today, patient was seen and examined.  Patient denies any nausea vomiting fever chills diarrhea.  Objective: Vitals:   03/24/22 1612 03/24/22 2148 03/25/22 0450 03/25/22 0805  BP: (!) 144/50 (!) 147/61 (!) 120/53 (!) 119/49  Pulse: 71 74 65 64  Resp: '16 18 18 16   '$ Temp: 97.9 F (36.6 C) 97.6 F (36.4 C) 98.9 F (37.2 C) 98.3 F (36.8 C)  TempSrc: Oral Oral Oral Oral  SpO2: 96% 98% 97% 98%  Weight:      Height:        Intake/Output Summary (Last 24 hours) at 03/25/2022 1402 Last data filed at 03/25/2022 0451 Gross per 24 hour  Intake 220 ml  Output 500 ml  Net -280 ml    Filed Weights   03/21/22 0616  Weight: 51.9 kg    Physical Examination: Body mass index is 21.62 kg/m.   General:  Average built, not in obvious distress elderly female, HENT:   No scleral pallor or icterus noted. Oral mucosa is moist.  Chest:  Clear breath sounds.  Diminished breath sounds bilaterally. No crackles or wheezes.  CVS: S1 &S2 heard. No murmur.  Regular rate and rhythm. Abdomen: Soft, nontender, nondistended.  Bowel sounds are heard.  Mild left costovertebral angle tenderness Extremities: No cyanosis, clubbing or edema.  Peripheral pulses are palpable. Psych: Alert, awake and oriented to place.  Normal mood CNS:  No cranial nerve deficits.  Power equal in all extremities.   Skin: Warm and dry.  No rashes noted.  Data Reviewed:   CBC: Recent Labs  Lab 03/21/22 0314 03/22/22 0259 03/23/22 0210 03/24/22 0221  WBC 20.3* 11.1* 8.2 8.0  NEUTROABS 17.3*  --   --   --   HGB 8.0* 7.3* 7.4* 7.5*  HCT 26.9* 24.5* 25.2* 25.0*  MCV 111.2* 110.9* 111.5* 109.6*  PLT 270 255 261 273     Basic Metabolic Panel: Recent Labs  Lab 03/21/22 0314 03/22/22 0259 03/23/22 0210 03/24/22 0221  NA 139 140 139 138  K 3.9 4.0 4.1 4.4  CL 104 107 106 107  CO2 21* '24 26 27  '$ GLUCOSE 162* 239* 151* 176*  BUN 23 26* 20 16  CREATININE 1.17* 1.25* 1.11* 1.04*  CALCIUM 8.9 8.5* 8.1* 8.3*  MG  --   --  1.9 1.9     Liver Function Tests: Recent Labs  Lab 03/21/22 0314  AST 22  ALT 14  ALKPHOS 43  BILITOT 0.7  PROT 6.0*  ALBUMIN 2.5*      Radiology Studies: No results found.    LOS: 4 days    Flora Lipps, MD [facility] Available via  Epic secure chat 7am-7pm After these hours, please refer to coverage provider listed on [email or web address] 03/25/2022, 2:02 PM

## 2022-03-25 NOTE — NC FL2 (Signed)
[place] MEDICAID FL2 LEVEL OF CARE SCREENING TOOL     IDENTIFICATION  Patient Name: Sara Reilly Birthdate: 01-18-1930 Sex: female Admission Date (Current Location): 03/21/2022  [facility] and [place] Number:  Herbalist and Address:  [facility], [place] [address], [place], [place] [address]      Provider Number: [id number]  Attending Physician Name and Address:  Flora Lipps, MD  Relative Name and Phone Number:       Current Level of Care: Hospital Recommended Level of Care: [place] Prior Approval Number:    Date Approved/Denied:   PASRR Number: [id number]  Discharge Plan: SNF    Current Diagnoses: Patient Active Problem List   Diagnosis Date Noted   Sepsis ([place]) 03/21/2022   Pyelonephritis of left kidney 03/21/2022   Sepsis secondary to UTI ([place]) 03/21/2022   Paroxysmal atrial fibrillation with RVR ([place]) 03/21/2022   T2DM (type 2 diabetes mellitus) ([place]) 03/21/2022   HTN (hypertension) 03/21/2022   IBS (irritable bowel syndrome) 03/21/2022   IDA (iron deficiency anemia) 03/21/2022   History of breast cancer 03/21/2022    Orientation RESPIRATION BLADDER Height & Weight     Self, Time, Situation, Place  Normal Incontinent Weight: 114 lb 6.7 oz (51.9 kg) Height:  '5\' 1"'$  (154.9 cm)  BEHAVIORAL SYMPTOMS/MOOD NEUROLOGICAL BOWEL NUTRITION STATUS      Continent Diet (Please see discharge summary)  AMBULATORY STATUS COMMUNICATION OF NEEDS Skin   Limited Assist Verbally Normal (WDL)                       Personal Care Assistance Level of Assistance  Bathing, Feeding, Dressing Bathing Assistance: Limited assistance Feeding assistance: Independent (can feed self) Dressing Assistance: Limited assistance     Functional Limitations Info  Sight, Hearing, Speech Sight Info: Adequate (WDL) Hearing Info: Adequate Speech Info: Adequate    SPECIAL CARE FACTORS FREQUENCY  PT (By licensed PT), OT (By licensed OT)      PT Frequency: 5x min weekly OT Frequency: 5x min weekly            Contractures Contractures Info: Not present    Additional Factors Info  Code Status, Allergies, Insulin Sliding Scale Code Status Info: FULL Allergies Info: No Known Allergies   Insulin Sliding Scale Info: insulin aspart (novoLOG) injection 0-5 Units daily at bedtime,insulin aspart (novoLOG) injection 0-6 Units 3 times daily with meals       Current Medications (03/25/2022):  This is the current hospital active medication list Current Facility-Administered Medications  Medication Dose Route Frequency Provider Last Rate Last Admin   acetaminophen (TYLENOL) tablet 650 mg  650 mg Oral Q6H PRN Nicoletta Dress, Na, MD       Or   acetaminophen (TYLENOL) suppository 650 mg  650 mg Rectal Q6H PRN Nicoletta Dress, Na, MD       atorvastatin (LIPITOR) tablet 20 mg  20 mg Oral Daily Li, Na, MD   20 mg at 03/25/22 0908   cefTRIAXone (ROCEPHIN) 2 g in sodium chloride 0.9 % 100 mL IVPB  2 g Intravenous Q24H Li, Na, MD 200 mL/hr at 03/25/22 0912 2 g at 03/25/22 0912   dorzolamide-timolol (COSOPT) 2-0.5 % ophthalmic solution 1 drop  1 drop Both Eyes BID Nicoletta Dress, Na, MD   1 drop at 03/25/22 0909   enoxaparin (LOVENOX) injection 30 mg  30 mg Subcutaneous Q24H Nicoletta Dress, Na, MD   30 mg at 03/25/22 0910   feeding supplement ([place]) (  GLUCERNA SHAKE) liquid 237 mL  237 mL Oral BID BM Pokhrel, Laxman, MD   237 mL at 03/25/22 0911   gabapentin (NEURONTIN) capsule 300 mg  300 mg Oral Daily Nicoletta Dress, Na, MD   300 mg at 03/25/22 0908   hydrOXYzine (ATARAX) tablet 10 mg  10 mg Oral Daily PRN Nicoletta Dress, Na, MD       insulin aspart (novoLOG) injection 0-5 Units  0-5 Units Subcutaneous QHS Pokhrel, Laxman, MD       insulin aspart (novoLOG) injection 0-6 Units  0-6 Units Subcutaneous TID WC Pokhrel, Laxman, MD   2 Units at 03/25/22 1253   memantine (NAMENDA) tablet 10 mg  10 mg Oral QHS Li, Na, MD   10 mg at 03/24/22 2156   metoprolol succinate (TOPROL-XL) 24 hr tablet 12.5 mg  12.5  mg Oral Daily Li, Na, MD   12.5 mg at 03/25/22 0908   multivitamin with minerals tablet 1 tablet  1 tablet Oral Daily Pokhrel, Laxman, MD   1 tablet at 03/25/22 0908   ondansetron (ZOFRAN) tablet 4 mg  4 mg Oral Q6H PRN Nicoletta Dress, Na, MD       Or   ondansetron (ZOFRAN) injection 4 mg  4 mg Intravenous Q6H PRN Nicoletta Dress, Na, MD       traMADol (ULTRAM) tablet 50 mg  50 mg Oral Daily PRN Charlann Lange, MD         Discharge Medications: Please see discharge summary for a list of discharge medications.  Relevant Imaging Results:  Relevant Lab Results:   Additional Information [phone]  Milas Gain, LCSWA

## 2022-03-26 DIAGNOSIS — Z853 Personal history of malignant neoplasm of breast: Secondary | ICD-10-CM | POA: Diagnosis not present

## 2022-03-26 DIAGNOSIS — A419 Sepsis, unspecified organism: Secondary | ICD-10-CM | POA: Diagnosis not present

## 2022-03-26 DIAGNOSIS — I10 Essential (primary) hypertension: Secondary | ICD-10-CM | POA: Diagnosis not present

## 2022-03-26 DIAGNOSIS — N12 Tubulo-interstitial nephritis, not specified as acute or chronic: Secondary | ICD-10-CM | POA: Diagnosis not present

## 2022-03-26 LAB — CULTURE, BLOOD (ROUTINE X 2)
Culture: NO GROWTH
Culture: NO GROWTH
Special Requests: ADEQUATE
Special Requests: ADEQUATE

## 2022-03-26 LAB — GLUCOSE, CAPILLARY
Glucose-Capillary: 125 mg/dL — ABNORMAL HIGH (ref 70–99)
Glucose-Capillary: 163 mg/dL — ABNORMAL HIGH (ref 70–99)
Glucose-Capillary: 139 mg/dL — ABNORMAL HIGH (ref 70–99)
Glucose-Capillary: 185 mg/dL — ABNORMAL HIGH (ref 70–99)

## 2022-03-26 NOTE — Progress Notes (Signed)
PROGRESS NOTE    Sara Reilly  IRC:[id number] DOB: 1929-12-09 DOA: 03/21/2022 PCP: Rogers Blocker, MD    Brief Narrative:  Sara Reilly is a 88 y.o. female with past medical history of type 2 diabetes, hypertension, IBS, previous breast cancer status post lumpectomy presented to the hospital with fever, chills and dysuria with decreased appetite, fatigue and generalized weakness.  Patient was evaluated by EMS daily over the last 2 days.  She initially did not want to come to hospital, and her PCP prescribed her an antibiotic cefpodoxime 200 mg po daily.  Despite that her symptoms persisted so patient was brought into the hospital.  EMS noted temperature of 100.7 F.  In the ED, patient was noted to have mild hypotension with blood pressure of 90/67.  WBC was elevated at 20.3.  Lactate at 1.1.  COVID and influenza was negative.  CT scan of the abdomen and pelvis with findings of left pyelonephritis.  EKG showed normal sinus rhythm.  Patient received Ringer lactate IV, Rocephin and was admitted hospital for further evaluation and treatment   Assessment and plan  Principal Problem:   Sepsis ([place]) Active Problems:   Pyelonephritis of left kidney   Sepsis secondary to UTI ([place])   Paroxysmal atrial fibrillation with RVR (HCC)   T2DM (type 2 diabetes mellitus) ([place])   HTN (hypertension)   IBS (irritable bowel syndrome)   IDA (iron deficiency anemia)   History of breast cancer   Sepsis secondary to acute left pyelonephritis.   Improved at this time.  Patient had fever and leukocytosis on presentation with abnormal urinalysis.  CT scan abdomen showed signs of pyelonephritis.  Blood cultures negative in 5 days.  Urinalysis showed leukocytes and white cells more than 50.  Influenza and COVID was negative.  WBC was 20.3 on presentation and has trended down to 8.0. Temperature max of 98.7 Fahrenheit..  Continue IV Rocephin for now.   CT scan showed left pyelonephritis with no evidence of obstructive  uropathy. Previous urine cultures with pansensitive Klebsiella.  Urine culture and Blood cultures this time negative so far.  Diabetes mellitus type 2. Continue sliding scale insulin, Accu-Cheks diabetic diet.  Hhemoglobin A1c with 5.7. Seen by diabetic coordinator.  Essential hypertension. Continue metoprolol.  Blood pressure seems to be stable  History of irritable bowel syndrome.  Stable at this time.  History of iron deficiency anemia.   No report of any bleeding.  Latest hemoglobin of 7.5.  Hemoglobin was 10-11 in 2018.  History of breast cancer status postlumpectomy.  Currently stable.   Debility, weakness.   PT evaluation recommended skilled nursing facility at this time. Patient does have a rolling walker and cane at home.     DVT prophylaxis: enoxaparin (LOVENOX) injection 30 mg Start: 03/21/22 1000   Code Status:     Code Status: Full Code  Disposition:  SNF as per PT evaluation.  Medically stable for disposition.  Status is: Inpatient  Remains inpatient appropriate because: IV antibiotic for acute pyelonephritis, sepsis,await SNF   Family Communication:  Spoke with the patient's daughter at bedside on 03/23/22  Consultants:  None  Procedures:  None  Antimicrobials:  Rocephin IV 03/21/22>  Anti-infectives (From admission, onward)    Start     Dose/Rate Route Frequency Ordered Stop   03/22/22 0800  cefTRIAXone (ROCEPHIN) 2 g in sodium chloride 0.9 % 100 mL IVPB        2 g 200 mL/hr over 30 Minutes Intravenous Every 24 hours  03/21/22 0758 03/29/22 0759   03/21/22 0715  cefTRIAXone (ROCEPHIN) 2 g in sodium chloride 0.9 % 100 mL IVPB        2 g 200 mL/hr over 30 Minutes Intravenous  Once 03/21/22 0712 03/21/22 0803      Subjective: Today, patient was seen and examined at bedside.  Patient denies any nausea vomiting fever chills or rigor.  Objective: Vitals:   03/25/22 0805 03/25/22 2157 03/26/22 0433 03/26/22 0756  BP: (!) 119/49 (!) 140/59 (!) 139/41  (!) 139/50  Pulse: 64 70 71 69  Resp: '16 18 18 16  '$ Temp: 98.3 F (36.8 C) 98.3 F (36.8 C) 98.6 F (37 C) 98.7 F (37.1 C)  TempSrc: Oral Oral Oral Oral  SpO2: 98% 100% 100% 97%  Weight:      Height:        Intake/Output Summary (Last 24 hours) at 03/26/2022 1054 Last data filed at 03/26/2022 0442 Gross per 24 hour  Intake --  Output 750 ml  Net -750 ml    Filed Weights   03/21/22 0616  Weight: 51.9 kg    Physical Examination: Body mass index is 21.62 kg/m.   General: Average built female not in obvious distress alert awake and Communicative. HENT:   No scleral pallor or icterus noted. Oral mucosa is moist.  Chest:  Clear breath sounds.  No crackles or wheezes.  CVS: S1 &S2 heard. No murmur.  Regular rate and rhythm. Abdomen: Soft, nontender bowel sounds are heard.  Extremities: No cyanosis, clubbing or edema.  Peripheral pulses are palpable. Psych: Oriented to place and person.  Normal mood CNS:  No cranial nerve deficits.  Power equal in all extremities.   Skin: Warm and dry.  No rashes noted.  Data Reviewed:   CBC: Recent Labs  Lab 03/21/22 0314 03/22/22 0259 03/23/22 0210 03/24/22 0221  WBC 20.3* 11.1* 8.2 8.0  NEUTROABS 17.3*  --   --   --   HGB 8.0* 7.3* 7.4* 7.5*  HCT 26.9* 24.5* 25.2* 25.0*  MCV 111.2* 110.9* 111.5* 109.6*  PLT 270 255 261 273     Basic Metabolic Panel: Recent Labs  Lab 03/21/22 0314 03/22/22 0259 03/23/22 0210 03/24/22 0221  NA 139 140 139 138  K 3.9 4.0 4.1 4.4  CL 104 107 106 107  CO2 21* '24 26 27  '$ GLUCOSE 162* 239* 151* 176*  BUN 23 26* 20 16  CREATININE 1.17* 1.25* 1.11* 1.04*  CALCIUM 8.9 8.5* 8.1* 8.3*  MG  --   --  1.9 1.9     Liver Function Tests: Recent Labs  Lab 03/21/22 0314  AST 22  ALT 14  ALKPHOS 43  BILITOT 0.7  PROT 6.0*  ALBUMIN 2.5*      Radiology Studies: No results found.    LOS: 5 days    Flora Lipps, MD [facility] Available via Epic secure chat 7am-7pm After  these hours, please refer to coverage provider listed on [email or web address] 03/26/2022, 10:54 AM

## 2022-03-26 NOTE — Plan of Care (Signed)
  Problem: Education: Goal: Knowledge of General Education information will improve Description: Including pain rating scale, medication(s)/side effects and non-pharmacologic comfort measures Outcome: Progressing   Problem: Health Behavior/Discharge Planning: Goal: Ability to manage health-related needs will improve Outcome: Progressing   Problem: Activity: Goal: Risk for activity intolerance will decrease Outcome: Progressing   Problem: Nutrition: Goal: Adequate nutrition will be maintained Outcome: Progressing   Problem: Coping: Goal: Level of anxiety will decrease Outcome: Progressing   Problem: Pain Managment: Goal: General experience of comfort will improve Outcome: Progressing   Problem: Safety: Goal: Ability to remain free from injury will improve Outcome: Progressing   Problem: Skin Integrity: Goal: Risk for impaired skin integrity will decrease Outcome: Progressing   Problem: Coping: Goal: Ability to adjust to condition or change in health will improve Outcome: Progressing   Problem: Fluid Volume: Goal: Ability to maintain a balanced intake and output will improve Outcome: Progressing

## 2022-03-26 NOTE — TOC Progression Note (Signed)
Transition of Care [facility]) - Progression Note    Patient Details  Name: Sara Reilly MRN: [id number] Date of Birth: [birth year]  Transition of Care [facility]) CM/SW [place], [place] Phone Number: 03/26/2022, 12:16 PM  Clinical Narrative:    CSW provided bed offers to pt at bedside. Questions answered on insurance and facilities. Pt stating she is interested in [facility], as it is close to her house. Pt states she will speak with her daughter and son to decide. Pt declines CSW calling her children. Pt spoke with [facility] liaison and had her questions answered. TOC will continue to follow for DC needs.   Expected Discharge Plan: [place] Barriers to Discharge: Continued Medical Work up  Expected Discharge Plan and Services Expected Discharge Plan: [place] In-house Referral: Clinical Social Work     Living arrangements for the past 2 months: Single Family Home                                       Social Determinants of Health (SDOH) Interventions    Readmission Risk Interventions     No data to display

## 2022-03-26 NOTE — Care Management Important Message (Signed)
Important Message  Patient Details  Name: TANNIS BURSTEIN MRN: [id number] Date of Birth: [birth year]   Medicare Important Message Given:  Yes     Hannah Beat 03/26/2022, 12:39 PM

## 2022-03-26 NOTE — Progress Notes (Signed)
Mobility Specialist - Progress Note   03/26/22 1632  Mobility  Activity Transferred from bed to chair  Level of Assistance Minimal assist, patient does 75% or more  Assistive Device Front wheel walker  Distance Ambulated (ft) 5 ft  Activity Response Tolerated well  $Mobility charge 1 Mobility    Pt received in bed requesting to sit in recliner. Left in recliner w/ call bell in reach and all needs met.   Paulla Dolly Mobility Specialist

## 2022-03-27 DIAGNOSIS — Z853 Personal history of malignant neoplasm of breast: Secondary | ICD-10-CM | POA: Diagnosis not present

## 2022-03-27 DIAGNOSIS — A419 Sepsis, unspecified organism: Secondary | ICD-10-CM | POA: Diagnosis not present

## 2022-03-27 DIAGNOSIS — I10 Essential (primary) hypertension: Secondary | ICD-10-CM | POA: Diagnosis not present

## 2022-03-27 DIAGNOSIS — D509 Iron deficiency anemia, unspecified: Secondary | ICD-10-CM | POA: Diagnosis not present

## 2022-03-27 LAB — BASIC METABOLIC PANEL
Anion gap: 6 (ref 5–15)
BUN: 14 mg/dL (ref 8–23)
CO2: 28 mmol/L (ref 22–32)
Calcium: 8.4 mg/dL — ABNORMAL LOW (ref 8.9–10.3)
Chloride: 105 mmol/L (ref 98–111)
Creatinine, Ser: 0.99 mg/dL (ref 0.44–1.00)
GFR, Estimated: 54 mL/min — ABNORMAL LOW (ref >60)
Glucose, Bld: 151 mg/dL — ABNORMAL HIGH (ref 70–99)
Potassium: 4.5 mmol/L (ref 3.5–5.1)
Sodium: 139 mmol/L (ref 135–145)

## 2022-03-27 LAB — MAGNESIUM: Magnesium: 2 mg/dL (ref 1.7–2.4)

## 2022-03-27 LAB — GLUCOSE, CAPILLARY
Glucose-Capillary: 124 mg/dL — ABNORMAL HIGH (ref 70–99)
Glucose-Capillary: 148 mg/dL — ABNORMAL HIGH (ref 70–99)
Glucose-Capillary: 126 mg/dL — ABNORMAL HIGH (ref 70–99)
Glucose-Capillary: 180 mg/dL — ABNORMAL HIGH (ref 70–99)
Glucose-Capillary: 107 mg/dL — ABNORMAL HIGH (ref 70–99)

## 2022-03-27 LAB — CBC
HCT: 26.6 % — ABNORMAL LOW (ref 36.0–46.0)
Hemoglobin: 8.1 g/dL — ABNORMAL LOW (ref 12.0–15.0)
MCH: 33.5 pg (ref 26.0–34.0)
MCHC: 30.5 g/dL (ref 30.0–36.0)
MCV: 109.9 fL — ABNORMAL HIGH (ref 80.0–100.0)
Platelets: 293 10*3/uL (ref 150–400)
RBC: 2.42 MIL/uL — ABNORMAL LOW (ref 3.87–5.11)
RDW: 14 % (ref 11.5–15.5)
WBC: 6.8 10*3/uL (ref 4.0–10.5)
nRBC: 0 % (ref 0.0–0.2)

## 2022-03-27 NOTE — Progress Notes (Signed)
Mobility Specialist - Progress Note   03/27/22 1205  Mobility  Activity Repositioned in chair  Level of Assistance Moderate assist, patient does 50-74%  Assistive Device None  Distance Ambulated (ft) 0 ft  Activity Response Tolerated well  $Mobility charge 1 Mobility    Pt received in recliner requesting pericare and repositioning. Left in recliner w/ call bell in reach and all needs met.   Paulla Dolly Mobility Specialist

## 2022-03-27 NOTE — Plan of Care (Signed)
  Problem: Education: Goal: Knowledge of General Education information will improve Description: Including pain rating scale, medication(s)/side effects and non-pharmacologic comfort measures Outcome: Progressing   Problem: Coping: Goal: Level of anxiety will decrease Outcome: Progressing   Problem: Safety: Goal: Ability to remain free from injury will improve Outcome: Progressing   Problem: Education: Goal: Ability to describe self-care measures that may prevent or decrease complications (Diabetes Survival Skills Education) will improve Outcome: Progressing   Problem: Coping: Goal: Ability to adjust to condition or change in health will improve Outcome: Progressing

## 2022-03-27 NOTE — Progress Notes (Signed)
Physical Therapy Treatment Patient Details Name: Sara Reilly MRN: [id number] DOB: 1929-05-29 Today's Date: 03/27/2022   History of Present Illness 88 yo female with onset of weakness and fatigue, fever, chills, dysuria, was brought to hosp on 11/1, found to have sepsis from pyelonephritis and UTI.  PMHx:  breast CA, DM, diverticulosis, fecal incontinence, HTN, IBS, atherosclerosis, R lung scarring,    PT Comments    Patient is agreeable to PT. She was seated in the recliner chair on arrival to the room. She reports she is planning to go to rehab at discharge. The patient continues to require assistance for standing. Anterior weight shifting facilitation required to maintain standing balance initially. Min A required for ambulation in room with rolling walker with cues for safety and posture. Recommend to continue PT to maximize independence and decrease caregiver burden. SNF recommended at discharge.    Recommendations for follow up therapy are one component of a multi-disciplinary discharge planning process, led by the attending physician.  Recommendations may be updated based on patient status, additional functional criteria and insurance authorization.  Follow Up Recommendations  Skilled nursing-short term rehab (<3 hours/day) Can patient physically be transported by private vehicle: No   Assistance Recommended at Discharge Frequent or constant Supervision/Assistance  Patient can return home with the following A lot of help with walking and/or transfers;A little help with bathing/dressing/bathroom;Assistance with cooking/housework;Direct supervision/assist for medications management;Direct supervision/assist for financial management;Assist for transportation;Help with stairs or ramp for entrance   Equipment Recommendations  None recommended by PT    Recommendations for Other Services       Precautions / Restrictions Precautions Precautions: Fall Restrictions Weight Bearing  Restrictions: No     Mobility  Bed Mobility               General bed mobility comments: not assessed as patient sitting up on arrival and post session    Transfers Overall transfer level: Needs assistance Equipment used: Rolling walker (2 wheels) Transfers: Sit to/from Stand Sit to Stand: Mod assist           General transfer comment: lifting assistance required for standing as well as faciliation for anterior weight shift. verbal cues for hand placement    Ambulation/Gait Ambulation/Gait assistance: Min assist Gait Distance (Feet): 35 Feet Assistive device: Rolling walker (2 wheels) Gait Pattern/deviations: Step-through pattern, Decreased stride length, Trunk flexed Gait velocity: decreased     General Gait Details: verbal cues for safety and reinforcement of positioning of rolling walker. posture is flexed with fatigue. several standing rest breaks required. steadying assistance provided for safety   Stairs             Wheelchair Mobility    Modified Rankin (Stroke Patients Only)       Balance Overall balance assessment: Needs assistance Sitting-balance support: Feet supported Sitting balance-Leahy Scale: Fair   Postural control: Posterior lean Standing balance support: Bilateral upper extremity supported, During functional activity Standing balance-Leahy Scale: Poor Standing balance comment: external support required, anterior weight shifting faciliation                            Cognition Arousal/Alertness: Awake/alert Behavior During Therapy: WFL for tasks assessed/performed Overall Cognitive Status: No family/caregiver present to determine baseline cognitive functioning                                 General Comments:  patient is able to follow all commands with increased time        Exercises      General Comments        Pertinent Vitals/Pain Pain Assessment Pain Assessment: Faces Faces Pain Scale:  Hurts a little bit Pain Location: sacrum area Pain Descriptors / Indicators: Sore Pain Intervention(s): Repositioned, Monitored during session, Limited activity within patient's tolerance    Home Living                          Prior Function            PT Goals (current goals can now be found in the care plan section) Acute Rehab PT Goals Patient Stated Goal: to get to rehab PT Goal Formulation: With patient Time For Goal Achievement: 04/06/22 Potential to Achieve Goals: Good Progress towards PT goals: Progressing toward goals    Frequency    Min 3X/week      PT Plan Current plan remains appropriate    Co-evaluation              AM-PAC PT "6 Clicks" Mobility   Outcome Measure  Help needed turning from your back to your side while in a flat bed without using bedrails?: A Little Help needed moving from lying on your back to sitting on the side of a flat bed without using bedrails?: A Little Help needed moving to and from a bed to a chair (including a wheelchair)?: A Lot Help needed standing up from a chair using your arms (e.g., wheelchair or bedside chair)?: A Lot Help needed to walk in hospital room?: A Little Help needed climbing 3-5 steps with a railing? : Total 6 Click Score: 14    End of Session   Activity Tolerance: Patient tolerated treatment well Patient left: in chair;with call bell/phone within reach;with chair alarm set   PT Visit Diagnosis: Unsteadiness on feet (R26.81);Muscle weakness (generalized) (M62.81);Difficulty in walking, not elsewhere classified (R26.2)     Time: 2993-7169 PT Time Calculation (min) (ACUTE ONLY): 15 min  Charges:  $Therapeutic Activity: 8-22 mins                     Minna Merritts, PT, MPT    Percell Locus 03/27/2022, 1:36 PM

## 2022-03-27 NOTE — Progress Notes (Signed)
PROGRESS NOTE    Sara Reilly  JYN:[id number] DOB: 31-Jan-1930 DOA: 03/21/2022 PCP: Rogers Blocker, MD    Brief Narrative:  Sara Reilly is a 88 y.o. female with past medical history of type 2 diabetes, hypertension, IBS, previous breast cancer status post lumpectomy presented to the hospital with fever, chills and dysuria with decreased appetite, fatigue and generalized weakness.  Patient was evaluated by EMS daily over the last 2 days.  She initially did not want to come to hospital, and her PCP prescribed her an antibiotic cefpodoxime 200 mg po daily.  Despite that her symptoms persisted so patient was brought into the hospital.  EMS noted temperature of 100.7 F.  In the ED, patient was noted to have mild hypotension with blood pressure of 90/67.  WBC was elevated at 20.3.  Lactate at 1.1.  COVID and influenza was negative.  CT scan of the abdomen and pelvis with findings of left pyelonephritis.  EKG showed normal sinus rhythm.  Patient received Ringer lactate IV, Rocephin and was admitted hospital for further evaluation and treatment.  During hospitalization, patient has clinically improved and now awaiting for skilled nursing facility placement.  Assessment and plan  Principal Problem:   Sepsis ([place]) Active Problems:   Pyelonephritis of left kidney   Sepsis secondary to UTI (HCC)   Paroxysmal atrial fibrillation with RVR (HCC)   T2DM (type 2 diabetes mellitus) (HCC)   HTN (hypertension)   IBS (irritable bowel syndrome)   IDA (iron deficiency anemia)   History of breast cancer   Sepsis secondary to acute left pyelonephritis.   Improved at this time.  Patient had fever and leukocytosis on presentation with abnormal urinalysis.  CT scan abdomen showed signs of pyelonephritis.  Blood cultures negative in 5 days.  Urinalysis showed leukocytes and white cells more than 50.  Influenza and COVID was negative.  WBC was 20.3 on presentation and has trended down to 6.8.  Temperature max of 98.5  Fahrenheit.  Continue IV Rocephin for now.   CT scan showed left pyelonephritis with no evidence of obstructive uropathy. Previous urine cultures with pansensitive Klebsiella.  Urine culture and Blood cultures this time negative so far.  Diabetes mellitus type 2. Continue sliding scale insulin, Accu-Cheks diabetic diet.  Hemoglobin A1c with 5.7. Seen by diabetic coordinator.  Essential hypertension. Continue metoprolol.  Blood pressure seems to be stable  History of irritable bowel syndrome.  Stable at this time.  History of iron deficiency anemia.   No report of any bleeding.  Latest hemoglobin of 7.5.  Hemoglobin was 10-11 in 2018.  History of breast cancer status postlumpectomy.  Currently stable.   Debility, weakness.   PT evaluation recommended skilled nursing facility at this time. Patient does have a rolling walker and cane at home.    DVT prophylaxis: enoxaparin (LOVENOX) injection 30 mg Start: 03/21/22 1000   Code Status:     Code Status: Full Code  Disposition:  SNF as per PT evaluation.  Medically stable for disposition.  TOC aware.  Status is: Inpatient  Remains inpatient appropriate because: IV antibiotic for acute pyelonephritis, sepsis, waiting for skilled nursing facility placement   Family Communication:  Spoke with the patient's daughter at bedside on 03/23/22, unable to reach the daughter today.  Consultants:  None  Procedures:  None  Antimicrobials:  Rocephin IV 03/21/22>  Anti-infectives (From admission, onward)    Start     Dose/Rate Route Frequency Ordered Stop   03/22/22 0800  cefTRIAXone (ROCEPHIN)  2 g in sodium chloride 0.9 % 100 mL IVPB        2 g 200 mL/hr over 30 Minutes Intravenous Every 24 hours 03/21/22 0758 03/29/22 0759   03/21/22 0715  cefTRIAXone (ROCEPHIN) 2 g in sodium chloride 0.9 % 100 mL IVPB        2 g 200 mL/hr over 30 Minutes Intravenous  Once 03/21/22 0712 03/21/22 0803      Subjective: Today, patient was seen and  examined at bedside.  Patient denies interval complaints.  Denies any nausea vomiting fever chills nausea shortness of breath or chest pain. Objective: Vitals:   03/26/22 1603 03/26/22 2025 03/27/22 0635 03/27/22 0925  BP: (!) 135/58 (!) 140/54 (!) 168/60 (!) 153/63  Pulse: 71 66 68 72  Resp: '16 18 18 17  '$ Temp: 97.8 F (36.6 C) 97.9 F (36.6 C) 98 F (36.7 C) 98.5 F (36.9 C)  TempSrc: Oral Oral Oral Oral  SpO2: 99% 100% 98% 99%  Weight:      Height:        Intake/Output Summary (Last 24 hours) at 03/27/2022 1323 Last data filed at 03/27/2022 0929 Gross per 24 hour  Intake --  Output 500 ml  Net -500 ml    Filed Weights   03/21/22 0616  Weight: 51.9 kg    Physical Examination: Body mass index is 21.62 kg/m.   General:  Average built, not in obvious distress, alert awake and Communicative elderly female HENT:   No scleral pallor or icterus noted. Oral mucosa is moist.  Chest:  Clear breath sounds.  No crackles or wheezes.  CVS: S1 &S2 heard. No murmur.  Regular rate and rhythm. Abdomen: Soft, nontender, nondistended.  Bowel sounds are heard.   Extremities: No cyanosis, clubbing or edema.   Psych: Alert, awake and oriented to place and person.  Normal mood CNS:  No cranial nerve deficits.  Moves all extremities.  Skin: Warm and dry.  No rashes noted.   Data Reviewed:   CBC: Recent Labs  Lab 03/21/22 0314 03/22/22 0259 03/23/22 0210 03/24/22 0221 03/27/22 0338  WBC 20.3* 11.1* 8.2 8.0 6.8  NEUTROABS 17.3*  --   --   --   --   HGB 8.0* 7.3* 7.4* 7.5* 8.1*  HCT 26.9* 24.5* 25.2* 25.0* 26.6*  MCV 111.2* 110.9* 111.5* 109.6* 109.9*  PLT 270 255 261 273 293     Basic Metabolic Panel: Recent Labs  Lab 03/21/22 0314 03/22/22 0259 03/23/22 0210 03/24/22 0221 03/27/22 0338  NA 139 140 139 138 139  K 3.9 4.0 4.1 4.4 4.5  CL 104 107 106 107 105  CO2 21* '24 26 27 28  '$ GLUCOSE 162* 239* 151* 176* 151*  BUN 23 26* '20 16 14  '$ CREATININE 1.17* 1.25* 1.11* 1.04*  0.99  CALCIUM 8.9 8.5* 8.1* 8.3* 8.4*  MG  --   --  1.9 1.9 2.0     Liver Function Tests: Recent Labs  Lab 03/21/22 0314  AST 22  ALT 14  ALKPHOS 43  BILITOT 0.7  PROT 6.0*  ALBUMIN 2.5*      Radiology Studies: No results found.    LOS: 6 days    Flora Lipps, MD [facility] Available via Epic secure chat 7am-7pm After these hours, please refer to coverage provider listed on [email or web address] 03/27/2022, 1:23 PM

## 2022-03-27 NOTE — TOC Progression Note (Signed)
Transition of Care [facility]) - Progression Note    Patient Details  Name: Sara Reilly MRN: [id number] Date of Birth: [birth year]  [facility]) CM/SW Contact  Reece Agar, [place] Phone Number: 03/27/2022, 12:07 PM  Clinical Narrative:    CSW spoke with pt and friend at bedside about going to [facility]. Pt stated she spoke with her children and does not want [facility] anymore. Pt states that they decided on [facility] (on the acceptance list) and CSW started auth ref# [id number].  CSW will continue to follow auth status.     Expected Discharge Plan: [place] Barriers to Discharge: Continued Medical Work up  Expected Discharge Plan and Services Expected Discharge Plan: [place] In-house Referral: Clinical Social Work     Living arrangements for the past 2 months: Single Family Home                                       Social Determinants of Health (SDOH) Interventions    Readmission Risk Interventions     No data to display

## 2022-03-28 DIAGNOSIS — A419 Sepsis, unspecified organism: Secondary | ICD-10-CM | POA: Diagnosis not present

## 2022-03-28 DIAGNOSIS — Z853 Personal history of malignant neoplasm of breast: Secondary | ICD-10-CM | POA: Diagnosis not present

## 2022-03-28 DIAGNOSIS — I10 Essential (primary) hypertension: Secondary | ICD-10-CM | POA: Diagnosis not present

## 2022-03-28 DIAGNOSIS — N12 Tubulo-interstitial nephritis, not specified as acute or chronic: Secondary | ICD-10-CM | POA: Diagnosis not present

## 2022-03-28 LAB — GLUCOSE, CAPILLARY
Glucose-Capillary: 121 mg/dL — ABNORMAL HIGH (ref 70–99)
Glucose-Capillary: 201 mg/dL — ABNORMAL HIGH (ref 70–99)

## 2022-03-28 MED ORDER — HYDRALAZINE HCL 20 MG/ML IJ SOLN: 10.0000 mg | Freq: Four times a day (QID) | INTRAMUSCULAR | Status: DC | PRN

## 2022-03-28 MED ORDER — ADULT MULTIVITAMIN W/MINERALS CH: 1.0000 | ORAL_TABLET | Freq: Every day | ORAL | Status: AC

## 2022-03-28 MED ORDER — GLUCERNA SHAKE PO LIQD: 237.0000 mL | Freq: Two times a day (BID) | ORAL | 0 refills | Status: AC

## 2022-03-28 MED ORDER — ONDANSETRON HCL 4 MG PO TABS: 4.0000 mg | ORAL_TABLET | Freq: Four times a day (QID) | ORAL | 0 refills | Status: AC | PRN

## 2022-03-28 NOTE — Progress Notes (Signed)
Mobility Specialist - Progress Note   03/28/22 1119  Mobility  Activity Stood at bedside (Sit>Stand x3 at recliner)  Level of Assistance Standby assist, set-up cues, supervision of patient - no hands on  Assistive Device Front wheel walker  Distance Ambulated (ft) 0 ft  Activity Response Tolerated well  $Mobility charge 1 Mobility    Pt received in recliner requesting linen change and pericare. Left in recliner w/ all needs met and call bell in reach.   Paulla Dolly Mobility Specialist

## 2022-03-28 NOTE — TOC Progression Note (Signed)
Transition of Care [facility]) - Progression Note    Patient Details  Name: Sara Reilly MRN: [id number] Date of Birth: 05-Aug-1929  Transition of Care [facility]) CM/SW Contact  Reece Agar, [place] Phone Number: 03/28/2022, 10:17 AM  Clinical Narrative:    Freda Munro at [facility] will come at bedside to complete paperwork with pt . Pt would like to confirm the DC with daughter before DC. CSW attempted to contact pt daughter, she was no available CSW left a VM.   Expected Discharge Plan: [place] Barriers to Discharge: Continued Medical Work up  Expected Discharge Plan and Services Expected Discharge Plan: [place] In-house Referral: Clinical Social Work     Living arrangements for the past 2 months: Single Family Home                                       Social Determinants of Health (SDOH) Interventions    Readmission Risk Interventions     No data to display

## 2022-03-28 NOTE — Plan of Care (Signed)
  Problem: Education: Goal: Knowledge of General Education information will improve Description: Including pain rating scale, medication(s)/side effects and non-pharmacologic comfort measures 03/28/2022 1426 by Santa Lighter, RN Outcome: Adequate for Discharge 03/28/2022 0958 by Santa Lighter, RN Outcome: Progressing   Problem: Health Behavior/Discharge Planning: Goal: Ability to manage health-related needs will improve Outcome: Adequate for Discharge   Problem: Clinical Measurements: Goal: Ability to maintain clinical measurements within normal limits will improve Outcome: Adequate for Discharge Goal: Will remain free from infection Outcome: Adequate for Discharge Goal: Diagnostic test results will improve Outcome: Adequate for Discharge Goal: Respiratory complications will improve Outcome: Adequate for Discharge Goal: Cardiovascular complication will be avoided Outcome: Adequate for Discharge   Problem: Activity: Goal: Risk for activity intolerance will decrease Outcome: Adequate for Discharge   Problem: Nutrition: Goal: Adequate nutrition will be maintained Outcome: Adequate for Discharge   Problem: Coping: Goal: Level of anxiety will decrease Outcome: Adequate for Discharge   Problem: Elimination: Goal: Will not experience complications related to bowel motility Outcome: Adequate for Discharge Goal: Will not experience complications related to urinary retention Outcome: Adequate for Discharge   Problem: Pain Managment: Goal: General experience of comfort will improve Outcome: Adequate for Discharge   Problem: Safety: Goal: Ability to remain free from injury will improve Outcome: Adequate for Discharge   Problem: Skin Integrity: Goal: Risk for impaired skin integrity will decrease Outcome: Adequate for Discharge   Problem: Education: Goal: Ability to describe self-care measures that may prevent or decrease complications (Diabetes Survival Skills Education)  will improve Outcome: Adequate for Discharge Goal: Individualized Educational Video(s) Outcome: Adequate for Discharge   Problem: Coping: Goal: Ability to adjust to condition or change in health will improve Outcome: Adequate for Discharge   Problem: Fluid Volume: Goal: Ability to maintain a balanced intake and output will improve Outcome: Adequate for Discharge   Problem: Health Behavior/Discharge Planning: Goal: Ability to identify and utilize available resources and services will improve Outcome: Adequate for Discharge Goal: Ability to manage health-related needs will improve Outcome: Adequate for Discharge   Problem: Metabolic: Goal: Ability to maintain appropriate glucose levels will improve Outcome: Adequate for Discharge   Problem: Nutritional: Goal: Maintenance of adequate nutrition will improve Outcome: Adequate for Discharge Goal: Progress toward achieving an optimal weight will improve Outcome: Adequate for Discharge   Problem: Skin Integrity: Goal: Risk for impaired skin integrity will decrease Outcome: Adequate for Discharge   Problem: Tissue Perfusion: Goal: Adequacy of tissue perfusion will improve Outcome: Adequate for Discharge   Problem: Inadequate Intake (NI-2.1) Goal: Food and/or nutrient delivery Description: Individualized approach for food/nutrient provision. Outcome: Adequate for Discharge

## 2022-03-28 NOTE — Progress Notes (Signed)
PROGRESS NOTE    Sara Reilly  ZMO:[id number] DOB: 11/05/1929 DOA: 03/21/2022 PCP: Rogers Blocker, MD    Brief Narrative:  Sara Reilly is a 88 y.o. female with past medical history of type 2 diabetes, hypertension, IBS, previous breast cancer status post lumpectomy presented to the hospital with fever, chills and dysuria with decreased appetite, fatigue and generalized weakness.  Patient was evaluated by EMS daily over the last 2 days.  She initially did not want to come to hospital, and her PCP prescribed her an antibiotic cefpodoxime 200 mg po daily.  Despite that, her symptoms persisted so patient was brought into the hospital.  EMS noted temperature of 100.7 F.  In the ED, patient was noted to have mild hypotension with blood pressure of 90/67.  WBC was elevated at 20.3.  Lactate at 1.1.  COVID and influenza was negative.  CT scan of the abdomen and pelvis with findings of left pyelonephritis.  EKG showed normal sinus rhythm.  Patient received Ringer lactate IV, Rocephin and was admitted hospital for further evaluation and treatment.  During hospitalization, patient has clinically improved with IV antibiotic and now awaiting for skilled nursing facility placement.  Assessment and plan  Principal Problem:   Sepsis ([place]) Active Problems:   Pyelonephritis of left kidney   Sepsis secondary to UTI (HCC)   Paroxysmal atrial fibrillation with RVR (HCC)   T2DM (type 2 diabetes mellitus) (HCC)   HTN (hypertension)   IBS (irritable bowel syndrome)   IDA (iron deficiency anemia)   History of breast cancer   Sepsis secondary to acute left pyelonephritis.   Resolved at this time.  Patient had fever and leukocytosis on presentation with abnormal urinalysis.  CT scan abdomen showed signs of pyelonephritis.    Urinalysis showed leukocytes and white cells more than 50.  Influenza and COVID was negative.  WBC was 20.3 on presentation and has trended down to 6.8.  Temperature max of 98.4 Fahrenheit.   Received a course of IV Rocephin while in the hospital.   CT scan showed left pyelonephritis with no evidence of obstructive uropathy. Previous urine cultures with pansensitive Klebsiella.  Urine culture and Blood cultures this time negative so far.  Diabetes mellitus type 2 mild hyperglycemia.. Continue sliding scale insulin, Accu-Cheks diabetic diet.  Hemoglobin A1c with 5.7.   Essential hypertension. Continue metoprolol.  Blood pressure slightly elevated today.  Add as needed hydralazine.  History of irritable bowel syndrome.  Stable at this time.  History of iron deficiency anemia.     Latest hemoglobin of 8.1 could be dilutional.  No report of bleeding.Marland Kitchen    History of breast cancer status postlumpectomy.  Currently stable.   Debility, weakness.   PT evaluation recommended skilled nursing facility at this time. Patient does have a rolling walker and cane at home.    DVT prophylaxis: enoxaparin (LOVENOX) injection 30 mg Start: 03/21/22 1000   Code Status:     Code Status: Full Code  Disposition:  SNF as per PT evaluation.  Medically stable for disposition.  TOC aware.  Status is: Inpatient  Remains inpatient appropriate because: Awaiting for skilled nursing facility placement   Family Communication:  None today.  Consultants:  None  Procedures:  None  Antimicrobials:  Rocephin IV 03/21/22>03/28/22  Anti-infectives (From admission, onward)    Start     Dose/Rate Route Frequency Ordered Stop   03/22/22 0800  cefTRIAXone (ROCEPHIN) 2 g in sodium chloride 0.9 % 100 mL IVPB  2 g 200 mL/hr over 30 Minutes Intravenous Every 24 hours 03/21/22 0758 03/28/22 1024   03/21/22 0715  cefTRIAXone (ROCEPHIN) 2 g in sodium chloride 0.9 % 100 mL IVPB        2 g 200 mL/hr over 30 Minutes Intravenous  Once 03/21/22 0712 03/21/22 0803      Subjective: Today, patient was seen and examined at bedside.  Denies any interval complaints.  Inquiring about discharge.  Denies any  nausea vomiting fever chills or rigor.  Has mild cough.  Objective: Vitals:   03/27/22 1807 03/27/22 2113 03/28/22 0424 03/28/22 0751  BP: (!) 111/45 (!) 168/63 (!) 155/58 (!) 164/71  Pulse: 76 70 66 72  Resp: '18 17  16  '$ Temp: 98.2 F (36.8 C) 97.8 F (36.6 C) 97.6 F (36.4 C) 98.4 F (36.9 C)  TempSrc: Oral Oral  Oral  SpO2: 100% 99% 100% 98%  Weight:      Height:        Intake/Output Summary (Last 24 hours) at 03/28/2022 1154 Last data filed at 03/28/2022 0853 Gross per 24 hour  Intake 600 ml  Output 600 ml  Net 0 ml    Filed Weights   03/21/22 0616  Weight: 51.9 kg    Physical Examination: Body mass index is 21.62 kg/m.   General:  Average built, not in obvious distress and Communicative, elderly female HENT:   No scleral pallor or icterus noted. Oral mucosa is moist.  Chest:  Clear breath sounds.  . No crackles or wheezes.  CVS: S1 &S2 heard. No murmur.  Regular rate and rhythm. Abdomen: Soft, nontender, nondistended.  Bowel sounds are heard.   Extremities: No cyanosis, clubbing or edema.  Peripheral pulses are palpable. Psych: Alert, awake and oriented to place and person, normal mood CNS:  No cranial nerve deficits.  Power equal in all extremities.  Generalized weakness noted Skin: Warm and dry.  No rashes noted.   Data Reviewed:   CBC: Recent Labs  Lab 03/22/22 0259 03/23/22 0210 03/24/22 0221 03/27/22 0338  WBC 11.1* 8.2 8.0 6.8  HGB 7.3* 7.4* 7.5* 8.1*  HCT 24.5* 25.2* 25.0* 26.6*  MCV 110.9* 111.5* 109.6* 109.9*  PLT 255 261 273 293     Basic Metabolic Panel: Recent Labs  Lab 03/22/22 0259 03/23/22 0210 03/24/22 0221 03/27/22 0338  NA 140 139 138 139  K 4.0 4.1 4.4 4.5  CL 107 106 107 105  CO2 '24 26 27 28  '$ GLUCOSE 239* 151* 176* 151*  BUN 26* '20 16 14  '$ CREATININE 1.25* 1.11* 1.04* 0.99  CALCIUM 8.5* 8.1* 8.3* 8.4*  MG  --  1.9 1.9 2.0     Liver Function Tests: No results for input(s): "AST", "ALT", "ALKPHOS", "BILITOT",  "PROT", "ALBUMIN" in the last 168 hours.    Radiology Studies: No results found.    LOS: 7 days    Flora Lipps, MD [facility] Available via Epic secure chat 7am-7pm After these hours, please refer to coverage provider listed on [email or web address] 03/28/2022, 11:54 AM

## 2022-03-28 NOTE — Plan of Care (Signed)
  Problem: Education: Goal: Knowledge of General Education information will improve Description Including pain rating scale, medication(s)/side effects and non-pharmacologic comfort measures Outcome: Progressing   

## 2022-03-28 NOTE — Discharge Summary (Addendum)
Physician Discharge Summary  Sara Reilly:[id number] DOB: 17-Dec-1929 DOA: 03/21/2022  PCP: Rogers Blocker, MD  Admit date: 03/21/2022 Discharge date: 03/28/2022  Admitted From: Home  Discharge disposition: Skilled nursing facility  Recommendations for Outpatient Follow-Up:   Follow up with your primary care provider at the skilled nursing facility in 3 to 5 days. Check CBC, BMP, magnesium in the next visit   Discharge Diagnosis:   Principal Problem:   Sepsis (HCC)-resolved Active Problems:   Pyelonephritis of left kidney     T2DM (type 2 diabetes mellitus) (HCC)   HTN (hypertension)   IBS (irritable bowel syndrome)   IDA (iron deficiency anemia)   History of breast cancer   Discharge Condition: Improved.  Diet recommendation: Low sodium, heart healthy.  Carbohydrate-modified.  Regular.  Wound care: None.  Code status: Full.   History of Present Illness:   Sara Reilly is a 88 y.o. female with past medical history of type 2 diabetes, hypertension, IBS, previous breast cancer status post lumpectomy presented to the hospital with fever, chills and dysuria with decreased appetite, fatigue and generalized weakness.  Patient was evaluated by EMS daily over the last 2 days.  She initially did not want to come to hospital, and her PCP prescribed her an antibiotic cefpodoxime 200 mg po daily.  Despite that, her symptoms persisted so patient was brought into the hospital.  EMS noted temperature of 100.7 F.  In the ED, patient was noted to have mild hypotension with blood pressure of 90/67.  WBC was elevated at 20.3.  Lactate at 1.1.  COVID and influenza was negative.  CT scan of the abdomen and pelvis with findings of left pyelonephritis.  EKG showed normal sinus rhythm.  Patient received Ringer lactate IV, Rocephin and was admitted hospital for further evaluation and treatment.   Hospital Course:   Following conditions were addressed during hospitalization as listed  below,  Sepsis secondary to acute left pyelonephritis.   Resolved at this time.  Patient had fever and leukocytosis on presentation with abnormal urinalysis.  CT scan abdomen showed signs of pyelonephritis. Urinalysis showed leukocytes and white cells more than 50.  Influenza and COVID was negative.  WBC was 20.3 on presentation and has trended down to 6.8.  Temperature max of 98.4 Fahrenheit.  Received a 7 day course of IV Rocephin while in the hospital.   Previous urine cultures with pansensitive Klebsiella.  Urine culture and Blood cultures this time negative so far.   Diabetes mellitus type 2 mild hyperglycemia.. Continue diabetic diet and glimepiride from home.  Hemoglobin A1c with 5.7.    Essential hypertension. Continue metoprolol   History of irritable bowel syndrome.  Stable at this time.   History of iron deficiency anemia.     Latest hemoglobin of 8.1 could be dilutional.  No report of bleeding.Marland Kitchen     History of breast cancer status postlumpectomy.  Currently stable.    Debility, weakness.   PT evaluation recommended skilled nursing facility at this time.  Disposition.  At this time, patient is stable for disposition to skilled nursing facility.  Medical Consultants:   None.  Procedures:    None Subjective:   Today, patient  was seen and examined at bedside.  Denies any interval complaints.  Inquiring about discharge.  Denies any nausea vomiting fever chills or rigor.  Has mild cough.   Discharge Exam:   Vitals:   03/28/22 0424 03/28/22 0751  BP: (!) 155/58 (!) 164/71  Pulse: 66  72  Resp:  16  Temp: 97.6 F (36.4 C) 98.4 F (36.9 C)  SpO2: 100% 98%   Vitals:   03/27/22 1807 03/27/22 2113 03/28/22 0424 03/28/22 0751  BP: (!) 111/45 (!) 168/63 (!) 155/58 (!) 164/71  Pulse: 76 70 66 72  Resp: _0 Temp: 98.2 F (36.8 C) 97.8 F (36.6 C) 97.6 F (36.4 C) 98.4 F (36.9 C)  TempSrc: Oral Oral  Oral  SpO2: 100% 99% 100% 98%  Weight:      Height:       Body mass index is 21.62 kg/m.    General:  Average built, not in obvious distress and Communicative, elderly female HENT:   No scleral pallor or icterus noted. Oral mucosa is moist.  Chest:  Clear breath sounds.  . No crackles or wheezes.  CVS: S1 &S2 heard. No murmur.  Regular rate and rhythm. Abdomen: Soft, nontender, nondistended.  Bowel sounds are heard.   Extremities: No cyanosis, clubbing or edema.  Peripheral pulses are palpable. Psych: Alert, awake and oriented to place and person, normal mood CNS:  No cranial nerve deficits.  Power equal in all extremities.  Generalized weakness noted Skin: Warm and dry.  No rashes noted.    The results of significant diagnostics from this hospitalization (including imaging, microbiology, ancillary and laboratory) are listed below for reference.     Diagnostic Studies:   CT ABDOMEN PELVIS W CONTRAST  Result Date: 03/21/2022 CLINICAL DATA:  Left lower quadrant pain EXAM: CT ABDOMEN AND PELVIS WITH CONTRAST TECHNIQUE: Multidetector CT imaging of the abdomen and pelvis was performed using the standard protocol following bolus administration of intravenous contrast. RADIATION DOSE REDUCTION: This exam was performed according to the departmental dose-optimization program which includes automated exposure control, adjustment of the mA and/or kV according to patient size and/or use of iterative reconstruction technique. CONTRAST:  69m OMNIPAQUE IOHEXOL 350 MG/ML SOLN COMPARISON:  12/27/2021. FINDINGS: Lower chest: Scarring at the lung bases, stable. History of lumpectomy with scar-like appearance in the medial and lower right breast. Atheromatous calcification of the coronary arteries. Hepatobiliary: No focal liver abnormality.No evidence of biliary obstruction or stone. Pancreas: No acute finding. Cystic density in the pancreatic head measuring 18 x 9 mm, unchanged. Follow-up recommendations were previously provided. Spleen: Unremarkable. Adrenals/Urinary  Tract: Negative adrenals. New hypoenhancement in the posterior aspect of the interpolar left kidney with possible urothelial thickening at the left renal pelvis. The regional fat is stranded. Findings most consistent with pyelonephritis given the appearance and rapid development. No discrete fluid collection although some liquefaction is possible due to the degree of central low-density. Unremarkable bladder. Stomach/Bowel: No obstruction. Numerous in generalized colonic diverticulosis. No visible bowel inflammation Vascular/Lymphatic: Extensive atheromatous plaque affecting the aorta and branch vessels with bilateral inflow stenoses. No mass or adenopathy. Reproductive:No pathologic findings. Other: No ascites or pneumoperitoneum. Musculoskeletal: Advanced spinal degeneration with mild scoliosis and L4-5 anterolisthesis. Advanced L4-5 spinal stenosis. Multilevel lumbar foraminal impingement. IMPRESSION: Findings of pyelonephritis on the left. Mild liquefaction may be present but no abscess or hydronephrosis. Electronically Signed   By: JJorje GuildM.D.   On: 03/21/2022 07:02   DG Chest 1 View  Result Date: 03/21/2022 CLINICAL DATA:  Sepsis EXAM: CHEST  1 VIEW COMPARISON:  03/10/2021 FINDINGS: Heart is normal size. Aortic atherosclerosis. Right basilar scarring, unchanged. No confluent airspace opacities or effusions. No acute bony abnormality. IMPRESSION: No active disease. Electronically Signed   By: KRolm BaptiseM.D.   On: 03/21/2022  03:48     Labs:   Basic Metabolic Panel: Recent Labs  Lab 03/22/22 0259 03/23/22 0210 03/24/22 0221 03/27/22 0338  NA 140 139 138 139  K 4.0 4.1 4.4 4.5  CL 107 106 107 105  CO2 _0 GLUCOSE 239* 151* 176* 151*  BUN 26* _1 CREATININE 1.25* 1.11* 1.04* 0.99  CALCIUM 8.5* 8.1* 8.3* 8.4*  MG  --  1.9 1.9 2.0   GFR Estimated Creatinine Clearance: 28.5 mL/min (by C-G formula based on SCr of 0.99 mg/dL). Liver Function Tests: No results for  input(s): "AST", "ALT", "ALKPHOS", "BILITOT", "PROT", "ALBUMIN" in the last 168 hours. No results for input(s): "LIPASE", "AMYLASE" in the last 168 hours. No results for input(s): "AMMONIA" in the last 168 hours. Coagulation profile No results for input(s): "INR", "PROTIME" in the last 168 hours.  CBC: Recent Labs  Lab 03/22/22 0259 03/23/22 0210 03/24/22 0221 03/27/22 0338  WBC 11.1* 8.2 8.0 6.8  HGB 7.3* 7.4* 7.5* 8.1*  HCT 24.5* 25.2* 25.0* 26.6*  MCV 110.9* 111.5* 109.6* 109.9*  PLT 255 261 273 293   Cardiac Enzymes: No results for input(s): "CKTOTAL", "CKMB", "CKMBINDEX", "TROPONINI" in the last 168 hours. BNP: Invalid input(s): "POCBNP" CBG: Recent Labs  Lab 03/27/22 1150 03/27/22 1620 03/27/22 2118 03/28/22 0752 03/28/22 1143  GLUCAP 126* 180* 107* 121* 201*   D-Dimer No results for input(s): "DDIMER" in the last 72 hours. Hgb A1c No results for input(s): "HGBA1C" in the last 72 hours. Lipid Profile No results for input(s): "CHOL", "HDL", "LDLCALC", "TRIG", "CHOLHDL", "LDLDIRECT" in the last 72 hours. Thyroid function studies No results for input(s): "TSH", "T4TOTAL", "T3FREE", "THYROIDAB" in the last 72 hours.  Invalid input(s): "FREET3" Anemia work up No results for input(s): "VITAMINB12", "FOLATE", "FERRITIN", "TIBC", "IRON", "RETICCTPCT" in the last 72 hours. Microbiology Recent Results (from the past 240 hour(s))  Urine Culture     Status: None   Collection Time: 03/21/22  3:11 AM   Specimen: Urine, Clean Catch  Result Value Ref Range Status   Specimen Description URINE, CLEAN CATCH  Final   Special Requests NONE  Final   Culture   Final    NO GROWTH Performed at [facility], [address] [place], [place] [address]    Report Status 03/22/2022 FINAL  Final  Resp Panel by RT-PCR (Flu A&B, Covid) Anterior Nasal Swab     Status: None   Collection Time: 03/21/22  3:12 AM   Specimen: Anterior Nasal Swab  Result Value Ref Range Status    SARS Coronavirus 2 by RT PCR NEGATIVE NEGATIVE Final    Comment: (NOTE) SARS-CoV-2 target nucleic acids are NOT DETECTED.  The SARS-CoV-2 RNA is generally detectable in upper respiratory specimens during the acute phase of infection. The lowest concentration of SARS-CoV-2 viral copies this assay can detect is 138 copies/mL. A negative result does not preclude SARS-Cov-2 infection and should not be used as the sole basis for treatment or other patient management decisions. A negative result may occur with  improper specimen collection/handling, submission of specimen other than nasopharyngeal swab, presence of viral mutation(s) within the areas targeted by this assay, and inadequate number of viral copies(<138 copies/mL). A negative result must be combined with clinical observations, patient history, and epidemiological information. The expected result is Negative.  Fact Sheet for Patients:  EntrepreneurPulse.com.au  Fact Sheet for Healthcare Providers:  IncredibleEmployment.be  This test is no t yet approved or cleared by the [place]  States FDA and  has been authorized for detection and/or diagnosis of SARS-CoV-2 by FDA under an Emergency Use Authorization (EUA). This EUA will remain  in effect (meaning this test can be used) for the duration of the COVID-19 declaration under Section 564(b)(1) of the Act, 21 U.S.C.section 360bbb-3(b)(1), unless the authorization is terminated  or revoked sooner.       Influenza A by PCR NEGATIVE NEGATIVE Final   Influenza B by PCR NEGATIVE NEGATIVE Final    Comment: (NOTE) The Xpert Xpress SARS-CoV-2/FLU/RSV plus assay is intended as an aid in the diagnosis of influenza from Nasopharyngeal swab specimens and should not be used as a sole basis for treatment. Nasal washings and aspirates are unacceptable for Xpert Xpress SARS-CoV-2/FLU/RSV testing.  Fact Sheet for  Patients: EntrepreneurPulse.com.au  Fact Sheet for Healthcare Providers: IncredibleEmployment.be  This test is not yet approved or cleared by the [place] FDA and has been authorized for detection and/or diagnosis of SARS-CoV-2 by FDA under an Emergency Use Authorization (EUA). This EUA will remain in effect (meaning this test can be used) for the duration of the COVID-19 declaration under Section 564(b)(1) of the Act, 21 U.S.C. section 360bbb-3(b)(1), unless the authorization is terminated or revoked.  Performed at [facility], [place] [address] [place], [place] [address]   Blood culture (routine x 2)     Status: None   Collection Time: 03/21/22  3:17 AM   Specimen: BLOOD  Result Value Ref Range Status   Specimen Description BLOOD RIGHT ANTECUBITAL  Final   Special Requests   Final    BOTTLES DRAWN AEROBIC AND ANAEROBIC Blood Culture adequate volume   Culture   Final    NO GROWTH 5 DAYS Performed at [facility], [place] [address], [place], [place] [address]    Report Status 03/26/2022 FINAL  Final  Blood culture (routine x 2)     Status: None   Collection Time: 03/21/22 11:01 AM   Specimen: BLOOD LEFT ARM  Result Value Ref Range Status   Specimen Description BLOOD LEFT ARM  Final   Special Requests   Final    BOTTLES DRAWN AEROBIC AND ANAEROBIC Blood Culture adequate volume   Culture   Final    NO GROWTH 5 DAYS Performed at [facility], [place] [address], [place], [place] [address]    Report Status 03/26/2022 FINAL  Final     Discharge Instructions:   Discharge Instructions     Call MD for:  persistant nausea and vomiting   Complete by: As directed    Call MD for:  severe uncontrolled pain   Complete by: As directed    Call MD for:  temperature >100.4   Complete by: As directed    Diet Carb Modified   Complete by: As directed    Discharge instructions   Complete by: As directed    Follow-up with your  primary care provider at the skilled nursing facility in 3 to 5 days.   Increase activity slowly   Complete by: As directed       Allergies as of 03/28/2022   No Known Allergies      Medication List     STOP taking these medications    cefpodoxime 200 MG tablet Commonly known as: VANTIN       TAKE these medications    Accu-Chek Guide test strip Generic drug: glucose blood daily.   Accu-Chek Guide w/Device Kit See admin instructions.   Accu-Chek Softclix  Lancets lancets daily.   acetaminophen 325 MG tablet Commonly known as: TYLENOL Take 650 mg by mouth 2 (two) times daily as needed.   ammonium lactate 12 % lotion Commonly known as: LAC-HYDRIN Apply topically 2 (two) times daily.   atorvastatin 20 MG tablet Commonly known as: LIPITOR Take 20 mg by mouth daily.   dorzolamide-timolol 2-0.5 % ophthalmic solution Commonly known as: COSOPT Place 1 drop into the left eye 2 (two) times daily.   feeding supplement (GLUCERNA SHAKE) Liqd Take 237 mLs by mouth 2 (two) times daily between meals. Start taking on: March 29, 2022   furosemide 20 MG tablet Commonly known as: LASIX Take 20 mg by mouth 2 (two) times daily as needed for edema.   gabapentin 300 MG capsule Commonly known as: NEURONTIN Take 300 mg by mouth daily.   glimepiride 1 MG tablet Commonly known as: AMARYL Take 1 mg by mouth daily.   hydrOXYzine 10 MG tablet Commonly known as: ATARAX Take 10 mg by mouth daily as needed for itching.   Iron 240 (27 Fe) MG Tabs TAKE ONE TABLET EVERY OTHER DAY WITH 8 OUNCES OF WATER   memantine 10 MG tablet Commonly known as: NAMENDA Take 10 mg by mouth at bedtime.   metoprolol succinate 25 MG 24 hr tablet Commonly known as: TOPROL-XL Take 12.5 mg by mouth daily.   multivitamin with minerals Tabs tablet Take 1 tablet by mouth daily. Start taking on: March 29, 2022   nitroGLYCERIN 0.4 MG SL tablet Commonly known as: NITROSTAT Place 0.4 mg under  the tongue every 5 (five) minutes as needed for chest pain.   omeprazole 20 MG capsule Commonly known as: PRILOSEC Take 20 mg by mouth daily.   ondansetron 4 MG tablet Commonly known as: ZOFRAN Take 1 tablet (4 mg total) by mouth every 6 (six) hours as needed for nausea or vomiting.   Rocklatan 0.02-0.005 % Soln Generic drug: Netarsudil-Latanoprost Place 1 drop into both eyes at bedtime.   tiZANidine 4 MG tablet Commonly known as: ZANAFLEX Take 4 mg by mouth daily as needed (for sciatica).   traMADol 50 MG tablet Commonly known as: ULTRAM Take 50 mg by mouth daily as needed for moderate pain.        Time coordinating discharge: 39 minutes  Signed:  Deanna Wiater  [facility] 03/28/2022, 2:43 PM

## 2022-03-28 NOTE — TOC Transition Note (Signed)
Transition of Care [facility]) - CM/SW Discharge Note   Patient Details  Name: Sara Reilly MRN: [id number] Date of Birth: December 03, 1929  [facility] of [facility]) CM/SW Contact:  Tresa Endo Phone Number: 03/28/2022, 2:44 PM   Clinical Narrative:    Patient will DC to: [facility]  Anticipated DC date: 03/28/2022 Family notified: Attempted pt daughter Transport by: Corey Harold   Per MD patient ready for DC to [facility]. RN to call report prior to discharge [phone]). RN, patient, patient's family, and facility notified of DC. Discharge Summary and FL2 sent to facility. DC packet on chart. Ambulance transport requested for patient.   CSW will sign off for now as social work intervention is no longer needed. Please consult [place] again if new needs arise.       Barriers to Discharge: Continued Medical Work up   Patient Goals and CMS Choice Patient states their goals for this hospitalization and ongoing recovery are:: SNF CMS Medicare.gov Compare Post Acute Care list provided to:: Patient Choice offered to / list presented to : Patient  Discharge Placement                       Discharge Plan and Services In-house Referral: Clinical Social Work                                   Social Determinants of Health (SDOH) Interventions     Readmission Risk Interventions     No data to display

## 2022-08-15 ENCOUNTER — Other Ambulatory Visit (HOSPITAL_COMMUNITY): Payer: Self-pay | Admitting: Internal Medicine

## 2022-08-15 ENCOUNTER — Ambulatory Visit (INDEPENDENT_AMBULATORY_CARE_PROVIDER_SITE_OTHER)
Admission: RE | Admit: 2022-08-15 | Discharge: 2022-08-15 | Disposition: A | Discharge status: Home / Self Care | Payer: Medicare Other | Source: Ambulatory Visit | Attending: Vascular Surgery | Admitting: Vascular Surgery

## 2022-08-15 ENCOUNTER — Ambulatory Visit (HOSPITAL_COMMUNITY)
Admission: RE | Admit: 2022-08-15 | Discharge: 2022-08-15 | Disposition: A | Discharge status: Home / Self Care | Payer: Medicare Other | Source: Ambulatory Visit | Attending: Vascular Surgery | Admitting: Vascular Surgery

## 2022-08-15 DIAGNOSIS — R0989 Other specified symptoms and signs involving the circulatory and respiratory systems: Secondary | ICD-10-CM

## 2022-08-15 DIAGNOSIS — L97909 Non-pressure chronic ulcer of unspecified part of unspecified lower leg with unspecified severity: Secondary | ICD-10-CM | POA: Diagnosis present

## 2022-08-16 LAB — VAS US ABI WITH/WO TBI: Right ABI: 1.08

## 2022-10-01 ENCOUNTER — Encounter: Payer: Medicare Other | Admitting: Surgery

## 2022-10-04 ENCOUNTER — Encounter: Payer: Self-pay | Admitting: Vascular Surgery

## 2022-10-04 ENCOUNTER — Ambulatory Visit (INDEPENDENT_AMBULATORY_CARE_PROVIDER_SITE_OTHER): Payer: Medicare Other | Admitting: Vascular Surgery

## 2022-10-04 VITALS — BP 145/75 | HR 68 | Temp 98.0°F | Resp 20 | Ht 61.0 in | Wt 107.0 lb

## 2022-10-04 DIAGNOSIS — I70219 Atherosclerosis of native arteries of extremities with intermittent claudication, unspecified extremity: Secondary | ICD-10-CM

## 2022-10-04 NOTE — Progress Notes (Signed)
ASSESSMENT & PLAN   PERIPHERAL ARTERIAL DISEASE WITH CLAUDICATION: This patient has evidence of tibial artery occlusive disease on her noninvasive studies done in March.  She has minimal symptoms of claudication.  She has no rest pain or nonhealing ulcers.  At 84 I certainly would not recommend an aggressive approach unless she developed critical limb ischemia.  I have encouraged her to stay as active as possible.  Fortunately she is not a smoker.  She is on a statin.  If she develops any new vascular symptoms certainly would be happy to see her back at any time.  REASON FOR CONSULT:    Peripheral arterial disease with claudication.  The consult is requested by Dr. Willey Blade.  HPI:   Sara Reilly is a 89 y.o. female who was sent for evaluation of peripheral arterial disease.  She is ambulatory with a cane and denies any significant calf or thigh claudication.  I do not get any history of rest pain.  She has no history of nonhealing wounds.  She does have some discomfort in her feet at times related to neuropathy.  Risk factors for peripheral arterial disease include type 2 diabetes, hypertension, and a remote history of tobacco use.  She quit 40 to 50 years ago.  Past Medical History:  Diagnosis Date   Breast CA (HCC)    Diabetes (HCC)    Diverticulosis    Fecal incontinence    Hypertension    IBS (irritable bowel syndrome)     Family History  Problem Relation Age of Onset   Stroke Father     SOCIAL HISTORY: Social History   Tobacco Use   Smoking status: Former   Smokeless tobacco: Never  Substance Use Topics   Alcohol use: No    No Known Allergies  Current Outpatient Medications  Medication Sig Dispense Refill   ACCU-CHEK GUIDE test strip daily.     Accu-Chek Softclix Lancets lancets daily.     acetaminophen (TYLENOL) 325 MG tablet Take 650 mg by mouth 2 (two) times daily as needed.     ammonium lactate (LAC-HYDRIN) 12 % lotion Apply topically 2 (two) times daily.      atorvastatin (LIPITOR) 20 MG tablet Take 20 mg by mouth daily.     Blood Glucose Monitoring Suppl (ACCU-CHEK GUIDE) w/Device KIT See admin instructions.     dorzolamide-timolol (COSOPT) 22.3-6.8 MG/ML ophthalmic solution Place 1 drop into the left eye 2 (two) times daily.     feeding supplement, GLUCERNA SHAKE, (GLUCERNA SHAKE) LIQD Take 237 mLs by mouth 2 (two) times daily between meals.  0   Ferrous Gluconate (IRON) 240 (27 Fe) MG TABS TAKE ONE TABLET EVERY OTHER DAY WITH 8 OUNCES OF WATER     furosemide (LASIX) 20 MG tablet Take 20 mg by mouth 2 (two) times daily as needed for edema.     gabapentin (NEURONTIN) 100 MG capsule Take 100 mg by mouth at bedtime.     hydrOXYzine (ATARAX) 10 MG tablet Take 10 mg by mouth daily as needed for itching.     memantine (NAMENDA) 10 MG tablet Take 10 mg by mouth at bedtime.     metoprolol succinate (TOPROL-XL) 25 MG 24 hr tablet Take 12.5 mg by mouth daily.     Multiple Vitamin (MULTIVITAMIN WITH MINERALS) TABS tablet Take 1 tablet by mouth daily.     omeprazole (PRILOSEC) 20 MG capsule Take 20 mg by mouth daily.     ondansetron (ZOFRAN) 4 MG tablet Take  1 tablet (4 mg total) by mouth every 6 (six) hours as needed for nausea or vomiting.  0   ROCKLATAN 0.02-0.005 % SOLN Place 1 drop into both eyes at bedtime.     tiZANidine (ZANAFLEX) 4 MG tablet Take 4 mg by mouth daily as needed (for sciatica).     traMADol (ULTRAM) 50 MG tablet Take 50 mg by mouth daily as needed for moderate pain.     glimepiride (AMARYL) 1 MG tablet Take 1 mg by mouth daily. (Patient not taking: Reported on 03/21/2022)     nitroGLYCERIN (NITROSTAT) 0.4 MG SL tablet Place 0.4 mg under the tongue every 5 (five) minutes as needed for chest pain. (Patient not taking: Reported on 03/21/2022)     No current facility-administered medications for this visit.    REVIEW OF SYSTEMS:  [X]  denotes positive finding, [ ]  denotes negative finding Cardiac  Comments:  Chest pain or chest  pressure:    Shortness of breath upon exertion:    Short of breath when lying flat:    Irregular heart rhythm:        Vascular    Pain in calf, thigh, or hip brought on by ambulation:    Pain in feet at night that wakes you up from your sleep:     Blood clot in your veins:    Leg swelling:         Pulmonary    Oxygen at home:    Productive cough:     Wheezing:         Neurologic    Sudden weakness in arms or legs:     Sudden numbness in arms or legs:     Sudden onset of difficulty speaking or slurred speech:    Temporary loss of vision in one eye:     Problems with dizziness:         Gastrointestinal    Blood in stool:     Vomited blood:         Genitourinary    Burning when urinating:     Blood in urine:        Psychiatric    Major depression:         Hematologic    Bleeding problems:    Problems with blood clotting too easily:        Skin    Rashes or ulcers:        Constitutional    Fever or chills:    -  PHYSICAL EXAM:   Vitals:   10/04/22 1356  BP: (!) 145/75  Pulse: 68  Resp: 20  Temp: 98 F (36.7 C)  SpO2: 96%  Weight: 107 lb (48.5 kg)  Height: 5\' 1"  (1.549 m)   Body mass index is 20.22 kg/m. GENERAL: The patient is a well-nourished female, in no acute distress. The vital signs are documented above. CARDIAC: There is a regular rate and rhythm.  VASCULAR: I do not detect carotid bruits. She has palpable femoral pulses. I cannot palpate popliteal or pedal pulses. She has no significant lower extremity swelling. PULMONARY: There is good air exchange bilaterally without wheezing or rales. ABDOMEN: Soft and non-tender with normal pitched bowel sounds.  MUSCULOSKELETAL: There are no major deformities. NEUROLOGIC: No focal weakness or paresthesias are detected. SKIN: There are no ulcers or rashes noted. PSYCHIATRIC: The patient has a normal affect.  DATA:    ARTERIAL DOPPLER STUDY: I have reviewed the arterial Doppler study that was done on  08/15/2022.  On the right side there was a monophasic posterior tibial and dorsalis pedis signal.  ABI was 100% although this may be falsely elevated.  Toe pressure was 0.  On the left side, there was a monophasic posterior tibial and dorsalis pedis signal.  The arteries were calcified and could not be compressed.  Toe pressure was 0.  ARTERIAL DUPLEX: I have reviewed the arterial duplex scan that was also done on 08/15/2022.    On the right side there was biphasic flow down through the distal popliteal.  There was monophasic flow in the anterior tibial and peroneal artery and the posterior tibial artery was occluded.  On the left side, there was biphasic flow through the distal popliteal artery.  There was monophasic flow in the peroneal artery and the anterior tibial and posterior tibial arteries were occluded.  Waverly Ferrari [facility]

## 2022-11-20 ENCOUNTER — Ambulatory Visit (INDEPENDENT_AMBULATORY_CARE_PROVIDER_SITE_OTHER): Payer: Medicare Other | Admitting: Podiatry

## 2022-11-20 DIAGNOSIS — B351 Tinea unguium: Secondary | ICD-10-CM | POA: Diagnosis not present

## 2022-11-20 DIAGNOSIS — M79675 Pain in left toe(s): Secondary | ICD-10-CM | POA: Diagnosis not present

## 2022-11-20 DIAGNOSIS — M79674 Pain in right toe(s): Secondary | ICD-10-CM | POA: Diagnosis not present

## 2022-11-20 NOTE — Progress Notes (Signed)
       Subjective:  Patient ID: Sara Reilly, female    DOB: [birth year],  MRN: [id number]   Sara Reilly presents to clinic today for: No chief complaint on file. . Patient notes nails are thick, discolored, elongated and painful in shoegear when trying to ambulate.    PCP is Gwenyth Bender, MD.  No Known Allergies  Review of Systems: Negative except as noted in the HPI.  Objective:  There were no vitals filed for this visit.  JONATHAN DANGEL is a pleasant 89 y.o. female in NAD. AAO x 3.  Vascular Examination: Capillary refill time is 3-5 seconds to toes bilateral. Palpable pedal pulses b/l LE. Digital hair present b/l. No pedal edema b/l. Skin temperature gradient WNL b/l. No varicosities b/l. No cyanosis or clubbing noted b/l.   Dermatological Examination: Pedal skin with normal turgor, texture and tone b/l. No open wounds. No interdigital macerations b/l. Toenails x 9 are 3mm thick, discolored, dystrophic with subungual debris. There is pain with compression of the nail plates.  They are elongated x10.  The right hallux nail was approximately 5 mm thick and more tender during debridement today.     Latest Ref Rng & Units 03/22/2022    2:59 AM  Hemoglobin A1C  Hemoglobin-A1c 4.8 - 5.6 % 5.7    Assessment/Plan: 1. Pain due to onychomycosis of toenails of both feet     The mycotic toenails were sharply debrided x10 with sterile nail nippers and a power debriding burr to decrease bulk/thickness and length.    Apologized to patient for the wait during her appointment today.  Return in about 3 months (around 02/20/2023) for RFC.   Clerance Lav, DPM, FACFAS [facility]     [address] [place], [place] [address]                Office [phone]  Fax [phone]

## 2022-11-23 DIAGNOSIS — M79674 Pain in right toe(s): Secondary | ICD-10-CM | POA: Insufficient documentation

## 2023-01-10 ENCOUNTER — Other Ambulatory Visit: Payer: Self-pay | Admitting: Gastroenterology

## 2023-01-10 DIAGNOSIS — K862 Cyst of pancreas: Secondary | ICD-10-CM

## 2023-01-30 ENCOUNTER — Other Ambulatory Visit: Payer: Self-pay | Admitting: *Deleted

## 2023-01-30 DIAGNOSIS — I70219 Atherosclerosis of native arteries of extremities with intermittent claudication, unspecified extremity: Secondary | ICD-10-CM

## 2023-02-04 ENCOUNTER — Emergency Department (HOSPITAL_COMMUNITY)
Admission: EM | Admit: 2023-02-04 | Discharge: 2023-02-05 | Disposition: A | Discharge status: Home / Self Care | Payer: Medicare Other | Attending: Emergency Medicine | Admitting: Emergency Medicine

## 2023-02-04 ENCOUNTER — Emergency Department (HOSPITAL_COMMUNITY): Payer: Medicare Other

## 2023-02-04 DIAGNOSIS — E119 Type 2 diabetes mellitus without complications: Secondary | ICD-10-CM | POA: Insufficient documentation

## 2023-02-04 DIAGNOSIS — R051 Acute cough: Secondary | ICD-10-CM | POA: Diagnosis not present

## 2023-02-04 DIAGNOSIS — Z1152 Encounter for screening for COVID-19: Secondary | ICD-10-CM | POA: Insufficient documentation

## 2023-02-04 DIAGNOSIS — Z7984 Long term (current) use of oral hypoglycemic drugs: Secondary | ICD-10-CM | POA: Insufficient documentation

## 2023-02-04 DIAGNOSIS — I10 Essential (primary) hypertension: Secondary | ICD-10-CM | POA: Insufficient documentation

## 2023-02-04 DIAGNOSIS — R059 Cough, unspecified: Secondary | ICD-10-CM | POA: Diagnosis present

## 2023-02-04 DIAGNOSIS — Z79899 Other long term (current) drug therapy: Secondary | ICD-10-CM | POA: Insufficient documentation

## 2023-02-04 DIAGNOSIS — R7401 Elevation of levels of liver transaminase levels: Secondary | ICD-10-CM

## 2023-02-04 DIAGNOSIS — Z853 Personal history of malignant neoplasm of breast: Secondary | ICD-10-CM | POA: Diagnosis not present

## 2023-02-04 DIAGNOSIS — R0982 Postnasal drip: Secondary | ICD-10-CM | POA: Diagnosis not present

## 2023-02-04 DIAGNOSIS — R7989 Other specified abnormal findings of blood chemistry: Secondary | ICD-10-CM | POA: Diagnosis not present

## 2023-02-04 LAB — CBC WITH DIFFERENTIAL/PLATELET
Abs Immature Granulocytes: 0.1 10*3/uL — ABNORMAL HIGH (ref 0.00–0.07)
Basophils Absolute: 0 10*3/uL (ref 0.0–0.1)
Basophils Relative: 0 %
Eosinophils Absolute: 0.1 10*3/uL (ref 0.0–0.5)
Eosinophils Relative: 1 %
HCT: 32.3 % — ABNORMAL LOW (ref 36.0–46.0)
Hemoglobin: 9.9 g/dL — ABNORMAL LOW (ref 12.0–15.0)
Immature Granulocytes: 1 %
Lymphocytes Relative: 20 %
Lymphs Abs: 2.1 10*3/uL (ref 0.7–4.0)
MCH: 32.7 pg (ref 26.0–34.0)
MCHC: 30.7 g/dL (ref 30.0–36.0)
MCV: 106.6 fL — ABNORMAL HIGH (ref 80.0–100.0)
Monocytes Absolute: 0.8 10*3/uL (ref 0.1–1.0)
Monocytes Relative: 8 %
Neutro Abs: 7.5 10*3/uL (ref 1.7–7.7)
Neutrophils Relative %: 70 %
Platelets: 170 10*3/uL (ref 150–400)
RBC: 3.03 MIL/uL — ABNORMAL LOW (ref 3.87–5.11)
RDW: 11.7 % (ref 11.5–15.5)
WBC: 10.7 10*3/uL — ABNORMAL HIGH (ref 4.0–10.5)
nRBC: 0 % (ref 0.0–0.2)

## 2023-02-04 LAB — COMPREHENSIVE METABOLIC PANEL
ALT: 55 U/L — ABNORMAL HIGH (ref 0–44)
AST: 42 U/L — ABNORMAL HIGH (ref 15–41)
Albumin: 3.3 g/dL — ABNORMAL LOW (ref 3.5–5.0)
Alkaline Phosphatase: 58 U/L (ref 38–126)
Anion gap: 7 (ref 5–15)
BUN: 27 mg/dL — ABNORMAL HIGH (ref 8–23)
CO2: 26 mmol/L (ref 22–32)
Calcium: 9 mg/dL (ref 8.9–10.3)
Chloride: 103 mmol/L (ref 98–111)
Creatinine, Ser: 1.02 mg/dL — ABNORMAL HIGH (ref 0.44–1.00)
GFR, Estimated: 52 mL/min — ABNORMAL LOW (ref >60)
Glucose, Bld: 200 mg/dL — ABNORMAL HIGH (ref 70–99)
Potassium: 4.4 mmol/L (ref 3.5–5.1)
Sodium: 136 mmol/L (ref 135–145)
Total Bilirubin: 0.5 mg/dL (ref 0.3–1.2)
Total Protein: 6.9 g/dL (ref 6.5–8.1)

## 2023-02-04 LAB — RESP PANEL BY RT-PCR (RSV, FLU A&B, COVID)  RVPGX2
Influenza A by PCR: NEGATIVE
Influenza B by PCR: NEGATIVE
Resp Syncytial Virus by PCR: NEGATIVE
SARS Coronavirus 2 by RT PCR: NEGATIVE

## 2023-02-04 NOTE — ED Triage Notes (Signed)
Pt to the ed from [facility] with CC of cough and weakness x 1 week. Pt was seen by pcp today and called 911 for concerned of PNA. Pt denies cp, loc, dizziness at this time.

## 2023-02-04 NOTE — ED Provider Triage Note (Signed)
Emergency Medicine Provider Triage Evaluation Note  Sara Reilly , a 89 y.o. female  was evaluated in triage.  Pt complains of weakness.  Patient reports he was sent here from primary care provider for evaluation for generalized cough and weakness for the last week.  PCP called 911 out of concern for possible pneumonia.  Patient currently denies any chest pain, loss of consciousness, dizziness, abdominal pain, nausea, vomiting, diarrhea.  Denies any suicidal ideation or thoughts of self-harm.  Review of Systems  Positive: As above Negative: As above  Physical Exam  BP (!) 117/53 (BP Location: Right Arm)   Pulse 65   Temp 98.1 F (36.7 C) (Oral)   Resp 16   Ht 5\' 1"  (1.549 m)   Wt 48 kg   SpO2 100%   BMI 19.99 kg/m  Gen:   Awake, no distress   Resp:  Normal effort MSK:   Moves extremities without difficulty  Other:    Medical Decision Making  Medically screening exam initiated at 7:03 PM.  Appropriate orders placed.  Sara Reilly was informed that the remainder of the evaluation will be completed by another provider, this initial triage assessment does not replace that evaluation, and the importance of remaining in the ED until their evaluation is complete.     Smitty Knudsen, PA-C 02/04/23 1904

## 2023-02-05 MED ORDER — FLUTICASONE PROPIONATE 50 MCG/ACT NA SUSP: 2.0000 | Freq: Every day | NASAL | 0 refills | Status: AC

## 2023-02-05 MED ORDER — BENZONATATE 100 MG PO CAPS: 100.0000 mg | ORAL_CAPSULE | Freq: Three times a day (TID) | ORAL | 0 refills | Status: AC

## 2023-02-05 NOTE — ED Provider Notes (Signed)
[place] AT [facility] Provider Note   CSN: [id number] Arrival date & time: 02/04/23  1759     History  Chief Complaint  Patient presents with   Weakness    Sara Reilly is a 89 y.o. female.  The history is provided by the patient.  Cough Cough characteristics:  Non-productive Severity:  Moderate Onset quality:  Gradual Duration:  1 week Timing:  Intermittent Progression:  Unchanged Chronicity:  New Context: upper respiratory infection   Context comment:  Runny nose Relieved by:  Nothing Worsened by:  Nothing Ineffective treatments:  None tried Associated symptoms: rhinorrhea   Associated symptoms: no chest pain, no fever, no shortness of breath and no wheezing   Associated symptoms comment:  Fatigue  Risk factors: no chemical exposure   Patient with diabetes who presents with cough and fatigue x 1 week.  Sent in by PMD for chest xray for concern for pneumonia.      Past Medical History:  Diagnosis Date   Breast CA (HCC)    Diabetes (HCC)    Diverticulosis    Fecal incontinence    Hypertension    IBS (irritable bowel syndrome)      Home Medications Prior to Admission medications   Medication Sig Start Date End Date Taking? Authorizing Provider  benzonatate (TESSALON) 100 MG capsule Take 1 capsule (100 mg total) by mouth every 8 (eight) hours. 02/05/23  Yes Nohea Kras, MD  fluticasone [facility]) 50 MCG/ACT nasal spray Place 2 sprays into both nostrils daily. 02/05/23  Yes Maurico Perrell, MD  ACCU-CHEK GUIDE test strip daily. 10/13/21   [provider]  Accu-Chek Softclix Lancets lancets daily. 10/13/21   [provider]  acetaminophen (TYLENOL) 325 MG tablet Take 650 mg by mouth 2 (two) times daily as needed.    [provider]  ammonium lactate (LAC-HYDRIN) 12 % lotion Apply topically 2 (two) times daily. 10/24/21   [provider]  atorvastatin (LIPITOR) 20 MG tablet Take 20 mg by mouth  daily.    [provider]  Blood Glucose Monitoring Suppl (ACCU-CHEK GUIDE) w/Device KIT See admin instructions. 10/13/21   [provider]  dorzolamide-timolol (COSOPT) 22.3-6.8 MG/ML ophthalmic solution Place 1 drop into the left eye 2 (two) times daily. 04/23/20   [provider]  feeding supplement, GLUCERNA SHAKE, (GLUCERNA SHAKE) LIQD Take 237 mLs by mouth 2 (two) times daily between meals. 03/29/22   Pokhrel, Rebekah Chesterfield, MD  Ferrous Gluconate (IRON) 240 (27 Fe) MG TABS TAKE ONE TABLET EVERY OTHER DAY WITH 8 OUNCES OF WATER 06/29/20   [provider]  furosemide (LASIX) 20 MG tablet Take 20 mg by mouth 2 (two) times daily as needed for edema. 06/01/21   [provider]  gabapentin (NEURONTIN) 100 MG capsule Take 100 mg by mouth at bedtime. 08/20/22   [provider]  glimepiride (AMARYL) 1 MG tablet Take 1 mg by mouth daily. Patient not taking: Reported on 03/21/2022 09/05/21   [provider]  hydrOXYzine (ATARAX) 10 MG tablet Take 10 mg by mouth daily as needed for itching. 10/24/21   [provider]  memantine (NAMENDA) 10 MG tablet Take 10 mg by mouth at bedtime. 06/09/20   [provider]  metoprolol succinate (TOPROL-XL) 25 MG 24 hr tablet Take 12.5 mg by mouth daily. 06/26/20   [provider]  Multiple Vitamin (MULTIVITAMIN WITH MINERALS) TABS tablet Take 1 tablet by mouth daily. 03/29/22   Pokhrel, Rebekah Chesterfield, MD  nitroGLYCERIN (  NITROSTAT) 0.4 MG SL tablet Place 0.4 mg under the tongue every 5 (five) minutes as needed for chest pain. Patient not taking: Reported on 03/21/2022    [provider]  omeprazole (PRILOSEC) 20 MG capsule Take 20 mg by mouth daily.    [provider]  ondansetron (ZOFRAN) 4 MG tablet Take 1 tablet (4 mg total) by mouth every 6 (six) hours as needed for nausea or vomiting. 03/28/22   Pokhrel, Laxman, MD  ROCKLATAN 0.02-0.005 % SOLN Place 1 drop into both eyes at bedtime. 07/03/21    [provider]  tiZANidine (ZANAFLEX) 4 MG tablet Take 4 mg by mouth daily as needed (for sciatica). 07/28/20   [provider]  traMADol (ULTRAM) 50 MG tablet Take 50 mg by mouth daily as needed for moderate pain.    [provider]      Allergies    Patient has no known allergies.    Review of Systems   Review of Systems  Constitutional:  Negative for fever.  HENT:  Positive for rhinorrhea. Negative for facial swelling.   Eyes:  Negative for redness.  Respiratory:  Positive for cough. Negative for shortness of breath, wheezing and stridor.   Cardiovascular:  Negative for chest pain.  All other systems reviewed and are negative.   Physical Exam Updated Vital Signs BP 136/69 (BP Location: Left Arm)   Pulse 65   Temp 97.6 F (36.4 C)   Resp 16   Ht 5\' 1"  (1.549 m)   Wt 48 kg   SpO2 97%   BMI 19.99 kg/m  Physical Exam Vitals and nursing note reviewed.  Constitutional:      General: She is not in acute distress.    Appearance: She is well-developed.  HENT:     Head: Normocephalic and atraumatic.     Nose: Congestion present.     Mouth/Throat:     Comments: Cobblestoning in oropharynx and clear colorless drainage consistent with postnasal drip Eyes:     Pupils: Pupils are equal, round, and reactive to light.  Cardiovascular:     Rate and Rhythm: Normal rate and regular rhythm.     Pulses: Normal pulses.     Heart sounds: Normal heart sounds.  Pulmonary:     Effort: Pulmonary effort is normal. No respiratory distress.     Breath sounds: Normal breath sounds. No stridor. No wheezing, rhonchi or rales.  Abdominal:     General: Bowel sounds are normal. There is no distension.     Palpations: Abdomen is soft.     Tenderness: There is no abdominal tenderness. There is no guarding or rebound.  Musculoskeletal:        General: Normal range of motion.     Cervical back: Neck supple.  Skin:    General: Skin is dry.     Capillary Refill:  Capillary refill takes less than 2 seconds.     Findings: No erythema or rash.  Neurological:     General: No focal deficit present.     Deep Tendon Reflexes: Reflexes normal.  Psychiatric:        Mood and Affect: Mood normal.     ED Results / Procedures / Treatments   Labs (all labs ordered are listed, but only abnormal results are displayed) Results for orders placed or performed during the hospital encounter of 02/04/23  Resp panel by RT-PCR (RSV, Flu A&B, Covid) Anterior Nasal Swab   Specimen: Anterior Nasal Swab  Result Value Ref Range  SARS Coronavirus 2 by RT PCR NEGATIVE NEGATIVE   Influenza A by PCR NEGATIVE NEGATIVE   Influenza B by PCR NEGATIVE NEGATIVE   Resp Syncytial Virus by PCR NEGATIVE NEGATIVE  Comprehensive metabolic panel  Result Value Ref Range   Sodium 136 135 - 145 mmol/L   Potassium 4.4 3.5 - 5.1 mmol/L   Chloride 103 98 - 111 mmol/L   CO2 26 22 - 32 mmol/L   Glucose, Bld 200 (H) 70 - 99 mg/dL   BUN 27 (H) 8 - 23 mg/dL   Creatinine, Ser 1.61 (H) 0.44 - 1.00 mg/dL   Calcium 9.0 8.9 - 09.6 mg/dL   Total Protein 6.9 6.5 - 8.1 g/dL   Albumin 3.3 (L) 3.5 - 5.0 g/dL   AST 42 (H) 15 - 41 U/L   ALT 55 (H) 0 - 44 U/L   Alkaline Phosphatase 58 38 - 126 U/L   Total Bilirubin 0.5 0.3 - 1.2 mg/dL   GFR, Estimated 52 (L) >60 mL/min   Anion gap 7 5 - 15  CBC with Differential  Result Value Ref Range   WBC 10.7 (H) 4.0 - 10.5 K/uL   RBC 3.03 (L) 3.87 - 5.11 MIL/uL   Hemoglobin 9.9 (L) 12.0 - 15.0 g/dL   HCT 04.5 (L) 40.9 - 81.1 %   MCV 106.6 (H) 80.0 - 100.0 fL   MCH 32.7 26.0 - 34.0 pg   MCHC 30.7 30.0 - 36.0 g/dL   RDW 91.4 78.2 - 95.6 %   Platelets 170 150 - 400 K/uL   nRBC 0.0 0.0 - 0.2 %   Neutrophils Relative % 70 %   Neutro Abs 7.5 1.7 - 7.7 K/uL   Lymphocytes Relative 20 %   Lymphs Abs 2.1 0.7 - 4.0 K/uL   Monocytes Relative 8 %   Monocytes Absolute 0.8 0.1 - 1.0 K/uL   Eosinophils Relative 1 %   Eosinophils Absolute 0.1 0.0 - 0.5 K/uL    Basophils Relative 0 %   Basophils Absolute 0.0 0.0 - 0.1 K/uL   Immature Granulocytes 1 %   Abs Immature Granulocytes 0.10 (H) 0.00 - 0.07 K/uL   DG Chest 2 View  Result Date: 02/04/2023 CLINICAL DATA:  Cough and weakness EXAM: CHEST - 2 VIEW COMPARISON:  Radiographs 03/21/2022 FINDINGS: Stable cardiomediastinal silhouette. Aortic atherosclerotic calcification. Right mid lung atelectasis/scarring. No focal consolidation, pleural effusion, or pneumothorax. No displaced rib fractures. Elevated right hemidiaphragm. IMPRESSION: No acute cardiopulmonary disease. Electronically Signed   By: Minerva Fester M.D.   On: 02/04/2023 21:33    EKG Interpretation Date/Time:  Monday February 04 2023 18:35:10 EDT Ventricular Rate:  62 PR Interval:  130 QRS Duration:  68 QT Interval:  402 QTC Calculation: 408 R Axis:   -20  Text Interpretation: Normal sinus rhythm Confirmed by Nicanor Alcon, Morgana Rowley ([id number]) on 02/05/2023 2:10:17 AM  Radiology DG Chest 2 View  Result Date: 02/04/2023 CLINICAL DATA:  Cough and weakness EXAM: CHEST - 2 VIEW COMPARISON:  Radiographs 03/21/2022 FINDINGS: Stable cardiomediastinal silhouette. Aortic atherosclerotic calcification. Right mid lung atelectasis/scarring. No focal consolidation, pleural effusion, or pneumothorax. No displaced rib fractures. Elevated right hemidiaphragm. IMPRESSION: No acute cardiopulmonary disease. Electronically Signed   By: Minerva Fester M.D.   On: 02/04/2023 21:33    Procedures Procedures    Medications Ordered in ED Medications - No data to display  ED Course/ Medical Decision Making/ A&P  Medical Decision Making Patient with cough and rhinorrhea x 1 week.    Problems Addressed: Acute cough:    Details: Tessalon Post-nasal drip:    Details: Flonase and follow up   Amount and/or Complexity of Data Reviewed Independent Historian:     Details: Children, see above  External Data Reviewed: notes.     Details: Previous notes reviewed  Labs: ordered.    Details: Negative covid and flu.  White count 10.7 top normal, hemoglobin low 9.9 but at baseline, normal platelets.  Normal sodium 136, normal potassium 4.4, creatinine slight elevation 1.02 but at baseline, ast 42 slight elevation, alt 55 slight elevation  Radiology: ordered and independent interpretation performed.    Details: Negative CXR  Risk Prescription drug management. Risk Details: Differential is viral URI vs. Allergic symptoms.  Patient is well appearing with normal vitals and normal lung exam.  There are no signs of sepsis based on exam, vitals, labs or imaging. No pneumonia on CXR on exam.  Patient does not need admission at this time.  Patient does not require antibiotics at this time, and antibiotics would put the patient at risk for C diff.   I will start flonase for post nasal drip and tessalon.  Patient and family informed of slight elevation in AST and ALT and need for recheck, this was also printed on discharge papers.  Patient is stable for discharge with close follow up.      Final Clinical Impression(s) / ED Diagnoses Final diagnoses:  Acute cough  Post-nasal drip   Return for intractable cough, coughing up blood, fevers > 100.4 unrelieved by medication, shortness of breath, intractable vomiting, chest pain, shortness of breath, weakness, numbness, changes in speech, facial asymmetry, abdominal pain, passing out, Inability to tolerate liquids or food, cough, altered mental status or any concerns. No signs of systemic illness or infection. The patient is nontoxic-appearing on exam and vital signs are within normal limits.  I have reviewed the triage vital signs and the nursing notes. Pertinent labs & imaging results that were available during my care of the patient were reviewed by me and considered in my medical decision making (see chart for details). After history, exam, and medical workup I feel the patient has been  appropriately medically screened and is safe for discharge home. Pertinent diagnoses were discussed with the patient. Patient was given return precautions.  Rx / DC Orders ED Discharge Orders          Ordered    benzonatate (TESSALON) 100 MG capsule  Every 8 hours        02/05/23 0241    fluticasone (FLONASE) 50 MCG/ACT nasal spray  Daily        02/05/23 0241              Vamsi Apfel, MD 02/05/23 [id number]

## 2023-02-07 ENCOUNTER — Encounter: Payer: Self-pay | Admitting: Physician Assistant

## 2023-02-07 ENCOUNTER — Ambulatory Visit (HOSPITAL_COMMUNITY)
Admission: RE | Admit: 2023-02-07 | Discharge: 2023-02-07 | Disposition: A | Discharge status: Home / Self Care | Payer: Medicare Other | Source: Ambulatory Visit | Attending: Vascular Surgery | Admitting: Vascular Surgery

## 2023-02-07 ENCOUNTER — Ambulatory Visit (INDEPENDENT_AMBULATORY_CARE_PROVIDER_SITE_OTHER): Payer: Medicare Other | Admitting: Physician Assistant

## 2023-02-07 VITALS — BP 110/66 | HR 69 | Temp 98.5°F | Resp 18 | Ht 61.0 in | Wt 105.0 lb

## 2023-02-07 DIAGNOSIS — I70219 Atherosclerosis of native arteries of extremities with intermittent claudication, unspecified extremity: Secondary | ICD-10-CM | POA: Insufficient documentation

## 2023-02-07 LAB — VAS US ABI WITH/WO TBI
Left ABI: 0.9
Right ABI: 0.97

## 2023-02-07 NOTE — Progress Notes (Signed)
HISTORY AND PHYSICAL     CC:  follow up. Requesting Provider:  Gwenyth Bender, MD  HPI: This is a 89 y.o. female who is here today for follow up for PAD.    Pt was last seen 10/04/2022 and at that time, she had some neuropathy but did not really endorse claudication, rest pain or non healing wounds.  She did have some tibial disease on duplex but given her age and minimal sx, conservative measures were taken unless she were to develop CLI.  She has hx of DM, HTN and remote tobacco use as she quit about 40-50 years ago.  The pt returns today for follow up and here with her daughter.  Pt states that she will get a little bit of pain on the outside of the left knee and then have leg weakness.  She states she has fallen.  She tells me she does have neuropathy and sometimes feel like she is walking on rocks.  She goes to podiatry to have her toe nails trimmed.  She does protect her feet.  She states she has hx of sciatica.  She does have arthritis in her back per daughter.  She does walk some with a rollator or cane.  She does not have any non healing wounds or pain in her feet that wakes her up at night.  She denies any visual disturbances, unilateral weakness, numbness, paralysis, speech difficulties.    She was recently discharged from the hospital due to virus.  She was placed on flonase and tesslon pearls.  Her flonase was discontinued due to increased pressure in her eye.   This improved with discontinuing the flonase.   The pt is on a statin for cholesterol management.    The pt is not on an aspirin.    Other AC:  none The pt is on BB, diuretic for hypertension.  The pt is not on medication for diabetes. Tobacco hx:  former    Past Medical History:  Diagnosis Date   Breast CA (HCC)    Diabetes (HCC)    Diverticulosis    Fecal incontinence    Hypertension    IBS (irritable bowel syndrome)     Past Surgical History:  Procedure Laterality Date   APPENDECTOMY     BREAST LUMPECTOMY      CHOLECYSTECTOMY     COLONOSCOPY      No Known Allergies  Current Outpatient Medications  Medication Sig Dispense Refill   ACCU-CHEK GUIDE test strip daily.     Accu-Chek Softclix Lancets lancets daily.     acetaminophen (TYLENOL) 325 MG tablet Take 650 mg by mouth 2 (two) times daily as needed.     ammonium lactate (LAC-HYDRIN) 12 % lotion Apply topically 2 (two) times daily.     atorvastatin (LIPITOR) 20 MG tablet Take 20 mg by mouth daily.     benzonatate (TESSALON) 100 MG capsule Take 1 capsule (100 mg total) by mouth every 8 (eight) hours. 21 capsule 0   Blood Glucose Monitoring Suppl (ACCU-CHEK GUIDE) w/Device KIT See admin instructions.     dorzolamide-timolol (COSOPT) 22.3-6.8 MG/ML ophthalmic solution Place 1 drop into the left eye 2 (two) times daily.     feeding supplement, GLUCERNA SHAKE, (GLUCERNA SHAKE) LIQD Take 237 mLs by mouth 2 (two) times daily between meals.  0   Ferrous Gluconate (IRON) 240 (27 Fe) MG TABS TAKE ONE TABLET EVERY OTHER DAY WITH 8 OUNCES OF WATER     fluticasone (FLONASE) 50 MCG/ACT  nasal spray Place 2 sprays into both nostrils daily. 16 g 0   furosemide (LASIX) 20 MG tablet Take 20 mg by mouth 2 (two) times daily as needed for edema.     gabapentin (NEURONTIN) 100 MG capsule Take 100 mg by mouth at bedtime.     glimepiride (AMARYL) 1 MG tablet Take 1 mg by mouth daily. (Patient not taking: Reported on 03/21/2022)     hydrOXYzine (ATARAX) 10 MG tablet Take 10 mg by mouth daily as needed for itching.     memantine (NAMENDA) 10 MG tablet Take 10 mg by mouth at bedtime.     metoprolol succinate (TOPROL-XL) 25 MG 24 hr tablet Take 12.5 mg by mouth daily.     Multiple Vitamin (MULTIVITAMIN WITH MINERALS) TABS tablet Take 1 tablet by mouth daily.     nitroGLYCERIN (NITROSTAT) 0.4 MG SL tablet Place 0.4 mg under the tongue every 5 (five) minutes as needed for chest pain. (Patient not taking: Reported on 03/21/2022)     omeprazole (PRILOSEC) 20 MG capsule Take  20 mg by mouth daily.     ondansetron (ZOFRAN) 4 MG tablet Take 1 tablet (4 mg total) by mouth every 6 (six) hours as needed for nausea or vomiting.  0   ROCKLATAN 0.02-0.005 % SOLN Place 1 drop into both eyes at bedtime.     tiZANidine (ZANAFLEX) 4 MG tablet Take 4 mg by mouth daily as needed (for sciatica).     traMADol (ULTRAM) 50 MG tablet Take 50 mg by mouth daily as needed for moderate pain.     No current facility-administered medications for this visit.    Family History  Problem Relation Age of Onset   Stroke Father     Social History   Socioeconomic History   Marital status: Widowed    Spouse name: Not on file   Number of children: 2   Years of education: Not on file   Highest education level: Not on file  Occupational History   Not on file  Tobacco Use   Smoking status: Former   Smokeless tobacco: Never  Substance and Sexual Activity   Alcohol use: No   Drug use: No   Sexual activity: Not on file  Other Topics Concern   Not on file  Social History Narrative   Not on file   Social Determinants of Health   Financial Resource Strain: Not on file  Food Insecurity: No Food Insecurity (03/21/2022)   Hunger Vital Sign    Worried About Running Out of Food in the Last Year: Never true    Ran Out of Food in the Last Year: Never true  Transportation Needs: No Transportation Needs (03/21/2022)   PRAPARE - Administrator, Civil Service (Medical): No    Lack of Transportation (Non-Medical): No  Physical Activity: Not on file  Stress: Not on file  Social Connections: Unknown (10/03/2021)   Received from [facility], [facility]   Social Network    Social Network: Not on file  Intimate Partner Violence: Not At Risk (03/21/2022)   Humiliation, Afraid, Rape, and Kick questionnaire    Fear of Current or Ex-Partner: No    Emotionally Abused: No    Physically Abused: No    Sexually Abused: No     REVIEW OF SYSTEMS:   [X]  denotes positive finding, [ ]   denotes negative finding Cardiac  Comments:  Chest pain or chest pressure:    Shortness of breath upon exertion:  Short of breath when lying flat:    Irregular heart rhythm:        Vascular    Pain in calf, thigh, or hip brought on by ambulation: x See HPI  Pain in feet at night that wakes you up from your sleep:     Blood clot in your veins:    Leg swelling:         Pulmonary    Oxygen at home:    Productive cough:     Wheezing:  x       Neurologic    Sudden weakness in arms or legs:     Sudden numbness in arms or legs:     Sudden onset of difficulty speaking or slurred speech:    Temporary loss of vision in one eye:     Problems with dizziness:         Gastrointestinal    Blood in stool:     Vomited blood:         Genitourinary    Burning when urinating:     Blood in urine:        Psychiatric    Major depression:         Hematologic    Bleeding problems:    Problems with blood clotting too easily:        Skin    Rashes or ulcers:        Constitutional    Fever or chills: x chills    PHYSICAL EXAMINATION:  Today's Vitals   02/07/23 0836  BP: 110/66  Pulse: 69  Resp: 18  Temp: 98.5 F (36.9 C)  TempSrc: Temporal  SpO2: 94%  Weight: 105 lb (47.6 kg)  Height: 5\' 1"  (1.549 m)   Body mass index is 19.84 kg/m.   General:  WDWN in NAD; vital signs documented above Gait: Not observed HENT: WNL, normocephalic Pulmonary: normal non-labored breathing , without wheezing Cardiac: regular HR, without carotid bruits Skin: without rashes Vascular Exam/Pulses:  Right Left  Radial 2+ (normal) 1+ (weak)  AT Biphasic doppler Biphasic doppler  PT Biphasic doppler Monophasic doppler   Extremities: without ischemic changes, without Gangrene , without cellulitis; without open wounds Musculoskeletal: no muscle wasting or atrophy  Neurologic: A&O X 3 Psychiatric:  The pt has Normal affect.   Non-Invasive Vascular Imaging:   ABI's/TBI's on  02/07/2023: Right:  0.97/0.47 - Great toe pressure: 85 Left:  0.90/0.40 - Great toe pressure: 71   Previous ABI's/TBI's on 08/16/2022: Right:  1.08/0 - Great toe pressure: 0 Left:  [place]/0 - Great toe pressure:  absent  Previous arterial duplex on 08/16/2022: +-----------+--------+-----+---------------+----------+--------+  RIGHT     PSV cm/sRatioStenosis       Waveform  Comments  +-----------+--------+-----+---------------+----------+--------+  CFA Distal 92                          biphasic            +-----------+--------+-----+---------------+----------+--------+  DFA       87                          biphasic            +-----------+--------+-----+---------------+----------+--------+  SFA Prox   109                         biphasic            +-----------+--------+-----+---------------+----------+--------+  SFA Mid    49                          biphasic            +-----------+--------+-----+---------------+----------+--------+  SFA Distal 106     >2.0 50-74% stenosisbiphasic            +-----------+--------+-----+---------------+----------+--------+  POP Prox   34                          biphasic            +-----------+--------+-----+---------------+----------+--------+  POP Distal 32                          biphasic            +-----------+--------+-----+---------------+----------+--------+  ATA Distal 27                          monophasic          +-----------+--------+-----+---------------+----------+--------+  PTA Distal 0            occluded                           +-----------+--------+-----+---------------+----------+--------+  PERO Distal38                          monophasic          +-----------+--------+-----+---------------+----------+--------+   +-----------+--------+-----+---------------+----------+--------+  LEFT      PSV cm/sRatioStenosis       Waveform  Comments   +-----------+--------+-----+---------------+----------+--------+  CFA Distal 129                         biphasic            +-----------+--------+-----+---------------+----------+--------+  DFA       130                         biphasic            +-----------+--------+-----+---------------+----------+--------+  SFA Prox   164          30-49% stenosisbiphasic            +-----------+--------+-----+---------------+----------+--------+  SFA Mid    45                          biphasic            +-----------+--------+-----+---------------+----------+--------+  SFA Distal 57                          biphasic            +-----------+--------+-----+---------------+----------+--------+  POP Prox   34                          biphasic            +-----------+--------+-----+---------------+----------+--------+  POP Distal 30                          biphasic            +-----------+--------+-----+---------------+----------+--------+  ATA Distal 0  occluded                           +-----------+--------+-----+---------------+----------+--------+  PTA Distal 0            occluded                           +-----------+--------+-----+---------------+----------+--------+  PERO Distal30                          monophasic          +-----------+--------+-----+---------------+----------+--------+    Summary:  Right: 50-74% stenosis noted in the superficial femoral artery and/or  popliteal artery. Total occlusion noted in the posterior tibial artery.   Left: 30-49% stenosis noted in the superficial femoral artery. Total  occlusion noted in the anterior tibial artery. Total occlusion noted in  the posterior tibial artery     ASSESSMENT/PLAN:: 89 y.o. female here for follow up for PAD   -pt has some pain and weakness on the lateral aspect of the left knee with some falls.  She does not have pain in her feet that wake  her at night, she does not have any non healing wounds and her feet look good, and she does not have any claudication sx with walking.  She does have neuropathy in both feet.  Encouraged her to continue to protect her feet and inspect them daily.  She did inquire about compression socks-given that she does not really have any swelling in her legs and hx of PAD, would not recommend compression at this time. -She does not endorse any stroke symptoms.   -her ABI and toe pressures are actually improved from her last visit.   -do not feel that her symptoms she is currently having are from her PAD.   -recommended f/u with her PCP for further workup.   -discussed with pt and daughter to be extra careful to prevent falls and injury.   -continue statin -pt will f/u as needed.   Doreatha Massed, [facility] [phone]  Clinic MD:   Edilia Bo

## 2023-02-27 ENCOUNTER — Encounter: Payer: Self-pay | Admitting: Podiatry

## 2023-02-27 ENCOUNTER — Ambulatory Visit (INDEPENDENT_AMBULATORY_CARE_PROVIDER_SITE_OTHER): Payer: Medicare Other | Admitting: Podiatry

## 2023-02-27 DIAGNOSIS — E119 Type 2 diabetes mellitus without complications: Secondary | ICD-10-CM | POA: Diagnosis not present

## 2023-02-27 DIAGNOSIS — E1151 Type 2 diabetes mellitus with diabetic peripheral angiopathy without gangrene: Secondary | ICD-10-CM | POA: Diagnosis not present

## 2023-02-27 DIAGNOSIS — B351 Tinea unguium: Secondary | ICD-10-CM | POA: Diagnosis not present

## 2023-02-27 DIAGNOSIS — M79675 Pain in left toe(s): Secondary | ICD-10-CM | POA: Diagnosis not present

## 2023-02-27 DIAGNOSIS — M79674 Pain in right toe(s): Secondary | ICD-10-CM | POA: Diagnosis not present

## 2023-03-03 NOTE — Progress Notes (Signed)
ANNUAL DIABETIC FOOT EXAM  Subjective: Sara Reilly presents today annual diabetic foot exam.  Chief Complaint  Patient presents with   Diabetes    [facility] Doesn't check blood sugars LVPCP-01/2023   Patient confirms h/o diabetes.  Patient denies any h/o foot wounds.  Patient is followed by Vascular Surgery for atherosclerosis with intermittent claudication.  Risk factors: diabetes, PAD with intermittent claudication, HTN, h/o tobacco use in remission.  Sara Bender, MD is patient's PCP.  Past Medical History:  Diagnosis Date   Breast CA (HCC)    Diabetes (HCC)    Diverticulosis    Fecal incontinence    Hypertension    IBS (irritable bowel syndrome)    Patient Active Problem List   Diagnosis Date Noted   Pain due to onychomycosis of toenails of both feet 11/23/2022   Sepsis (HCC) 03/21/2022   Pyelonephritis of left kidney 03/21/2022   Sepsis secondary to UTI (HCC) 03/21/2022   Paroxysmal atrial fibrillation with RVR (HCC) 03/21/2022   T2DM (type 2 diabetes mellitus) (HCC) 03/21/2022   HTN (hypertension) 03/21/2022   IBS (irritable bowel syndrome) 03/21/2022   IDA (iron deficiency anemia) 03/21/2022   History of breast cancer 03/21/2022   Past Surgical History:  Procedure Laterality Date   APPENDECTOMY     BREAST LUMPECTOMY     CHOLECYSTECTOMY     COLONOSCOPY     Current Outpatient Medications on File Prior to Visit  Medication Sig Dispense Refill   ACCU-CHEK GUIDE test strip daily.     Accu-Chek Softclix Lancets lancets daily.     acetaminophen (TYLENOL) 325 MG tablet Take 650 mg by mouth 2 (two) times daily as needed.     ammonium lactate (LAC-HYDRIN) 12 % lotion Apply topically 2 (two) times daily.     atorvastatin (LIPITOR) 20 MG tablet Take 20 mg by mouth daily.     benzonatate (TESSALON) 100 MG capsule Take 1 capsule (100 mg total) by mouth every 8 (eight) hours. 21 capsule 0   Blood Glucose Monitoring Suppl (ACCU-CHEK GUIDE) w/Device KIT See admin  instructions.     dorzolamide-timolol (COSOPT) 22.3-6.8 MG/ML ophthalmic solution Place 1 drop into the left eye 2 (two) times daily.     feeding supplement, GLUCERNA SHAKE, (GLUCERNA SHAKE) LIQD Take 237 mLs by mouth 2 (two) times daily between meals.  0   Ferrous Gluconate (IRON) 240 (27 Fe) MG TABS TAKE ONE TABLET EVERY OTHER DAY WITH 8 OUNCES OF WATER     fluticasone (FLONASE) 50 MCG/ACT nasal spray Place 2 sprays into both nostrils daily. 16 g 0   furosemide (LASIX) 20 MG tablet Take 20 mg by mouth 2 (two) times daily as needed for edema.     gabapentin (NEURONTIN) 100 MG capsule Take 100 mg by mouth at bedtime.     glimepiride (AMARYL) 1 MG tablet Take 1 mg by mouth daily. (Patient not taking: Reported on 03/21/2022)     hydrOXYzine (ATARAX) 10 MG tablet Take 10 mg by mouth daily as needed for itching.     memantine (NAMENDA) 10 MG tablet Take 10 mg by mouth at bedtime.     metoprolol succinate (TOPROL-XL) 25 MG 24 hr tablet Take 12.5 mg by mouth daily.     Multiple Vitamin (MULTIVITAMIN WITH MINERALS) TABS tablet Take 1 tablet by mouth daily.     nitroGLYCERIN (NITROSTAT) 0.4 MG SL tablet Place 0.4 mg under the tongue every 5 (five) minutes as needed for chest pain. (Patient not taking: Reported on 03/21/2022)  omeprazole (PRILOSEC) 20 MG capsule Take 20 mg by mouth daily.     ondansetron (ZOFRAN) 4 MG tablet Take 1 tablet (4 mg total) by mouth every 6 (six) hours as needed for nausea or vomiting.  0   ROCKLATAN 0.02-0.005 % SOLN Place 1 drop into both eyes at bedtime.     tiZANidine (ZANAFLEX) 4 MG tablet Take 4 mg by mouth daily as needed (for sciatica).     traMADol (ULTRAM) 50 MG tablet Take 50 mg by mouth daily as needed for moderate pain.     No current facility-administered medications on file prior to visit.    No Known Allergies Social History   Occupational History   Not on file  Tobacco Use   Smoking status: Former   Smokeless tobacco: Never  Substance and Sexual  Activity   Alcohol use: No   Drug use: No   Sexual activity: Not on file   Family History  Problem Relation Age of Onset   Stroke Father     There is no immunization history on file for this patient.   Review of Systems: Negative except as noted in the HPI.   Objective: There were no vitals filed for this visit.  Sara Reilly is a pleasant 89 y.o. female in NAD. AAO X 3. Vascular Examination: CFT <4 seconds b/l. DP pulses diminished b/l. PT pulses diminished b/l. Digital hair absent. Skin temperature gradient warm to cool b/l. No ischemia or gangrene. No cyanosis or clubbing noted b/l.    Neurological Examination: Sensation grossly intact b/l with 10 gram monofilament. Vibratory sensation intact b/l.   Dermatological Examination: Pedal skin thin, shiny and atrophic b/l. No open wounds. No interdigital macerations.   Toenails 1-5 b/l thick, discolored, elongated with subungual debris and pain on dorsal palpation.   No corns, calluses nor porokeratotic lesions noted.  Musculoskeletal Examination: Normal muscle strength 5/5 to all lower extremity muscle groups bilaterally. No pain, crepitus or joint limitation noted with ROM b/l LE. No gross bony pedal deformities b/l. Patient ambulates with rollator assistance.  Radiographs: None  Last A1c:      Latest Ref Rng & Units 03/22/2022    2:59 AM  Hemoglobin A1C  Hemoglobin-A1c 4.8 - 5.6 % 5.7    Lab Results  Component Value Date   HGBA1C 5.7 (H) 03/22/2022   VAS [place] ABI WITH/WO TBI  Result Date: 02/07/2023  LOWER EXTREMITY DOPPLER STUDY Patient Name:  Sara Reilly  Date of Exam:   02/07/2023 Medical Rec #: [id number]       Accession #:    [id number] Date of Birth: Sep 04, 1929        Patient Gender: F Patient Age:   36 years Exam Location:  [facility] Procedure:      VAS [place] ABI WITH/WO TBI Referring Phys: CHRISTOPHER DICKSON --------------------------------------------------------------------------------   Indications: Claudication, ulceration, and peripheral artery disease. High Risk Factors: Hypertension, Diabetes.  Comparison Study: 08/16/22 prior Performing Technologist: [place] Ponder RVS  Examination Guidelines: A complete evaluation includes at minimum, Doppler waveform signals and systolic blood pressure reading at the level of bilateral brachial, anterior tibial, and posterior tibial arteries, when vessel segments are accessible. Bilateral testing is considered an integral part of a complete examination. Photoelectric Plethysmograph (PPG) waveforms and toe systolic pressure readings are included as required and additional duplex testing as needed. Limited examinations for reoccurring indications may be performed as noted.  ABI Findings: +---------+------------------+-----+--------+--------+ Right    Rt Pressure (mmHg)IndexWaveformComment  +---------+------------------+-----+--------+--------+  Brachial 179                                     +---------+------------------+-----+--------+--------+ PTA      125               0.70 biphasic         +---------+------------------+-----+--------+--------+ DP       173               0.97 biphasic         +---------+------------------+-----+--------+--------+ Great Toe85                0.47 Abnormal         +---------+------------------+-----+--------+--------+ +---------+------------------+-----+----------+-------+ Left     Lt Pressure (mmHg)IndexWaveform  Comment +---------+------------------+-----+----------+-------+ Brachial 169                                      +---------+------------------+-----+----------+-------+ PTA      123               0.69 monophasic        +---------+------------------+-----+----------+-------+ DP       161               0.90 biphasic          +---------+------------------+-----+----------+-------+ Great Toe71                0.40 Abnormal           +---------+------------------+-----+----------+-------+ +-------+-----------+-----------+------------+------------+ ABI/TBIToday's ABIToday's TBIPrevious ABIPrevious TBI +-------+-----------+-----------+------------+------------+ Right  0.97       0.47       1.08        0            +-------+-----------+-----------+------------+------------+ Left   0.90       0.40       [place]          0            +-------+-----------+-----------+------------+------------+  Summary: Right: Resting right ankle-brachial index is within normal range. The right toe-brachial index is abnormal. Left: Resting left ankle-brachial index indicates mild left lower extremity arterial disease. The left toe-brachial index is abnormal. *See table(s) above for measurements and observations.  Electronically signed by Waverly Ferrari MD on 02/07/2023 at 8:37:49 AM.    Final    ADA Risk Categorization: High Risk  Patient has one or more of the following: Loss of protective sensation Absent pedal pulses Severe Foot deformity History of foot ulcer  Assessment: 1. Pain due to onychomycosis of toenails of both feet   2. Type II diabetes mellitus with peripheral circulatory disorder (HCC)   3. Encounter for diabetic foot exam (HCC)     Plan: -Consent given for treatment as described below: -Examined patient. -Diabetic foot examination performed today. -Continue diabetic foot care principles: inspect feet daily, monitor glucose as recommended by PCP and/or Endocrinologist, and follow prescribed diet per PCP, Endocrinologist and/or dietician. -Continue supportive shoe gear daily. -Toenails 1-5 b/l were debrided in length and girth with sterile nail nippers and dremel without iatrogenic bleeding.  -Patient/POA to call should there be question/concern in the interim. Return in about 3 months (around 05/30/2023).  Freddie Breech, DPM

## 2023-06-12 ENCOUNTER — Ambulatory Visit: Payer: Medicare Other | Admitting: Podiatry

## 2023-10-01 ENCOUNTER — Encounter: Payer: Self-pay | Admitting: Podiatry

## 2023-10-01 ENCOUNTER — Ambulatory Visit (INDEPENDENT_AMBULATORY_CARE_PROVIDER_SITE_OTHER): Payer: Medicare Other | Admitting: Podiatry

## 2023-10-01 VITALS — Ht 61.0 in | Wt 105.0 lb

## 2023-10-01 DIAGNOSIS — B351 Tinea unguium: Secondary | ICD-10-CM

## 2023-10-01 DIAGNOSIS — E1151 Type 2 diabetes mellitus with diabetic peripheral angiopathy without gangrene: Secondary | ICD-10-CM

## 2023-10-01 DIAGNOSIS — M79674 Pain in right toe(s): Secondary | ICD-10-CM

## 2023-10-01 DIAGNOSIS — M79675 Pain in left toe(s): Secondary | ICD-10-CM | POA: Diagnosis not present

## 2023-10-06 NOTE — Progress Notes (Signed)
  Subjective:  Patient ID: Sara Reilly, female    DOB: 1929-08-09,  MRN: [id number]  Sara Reilly presents to clinic today for at risk foot care. Pt has h/o NIDDM with PAD and painful, elongated thickened toenails x 10 which are symptomatic when wearing enclosed shoe gear. This interferes with his/her daily activities.  Chief Complaint  Patient presents with   Nail Problem    Pt is here for [facility] unsure od last A1C PCP is Dr Rozelle Corning pt states it has been a while since LOV.   New problem(s): None.   PCP is Ferrell Hu, MD.  No Known Allergies  Review of Systems: Negative except as noted in the HPI.  Objective: No changes noted in today's physical examination. There were no vitals filed for this visit. Sara Reilly is a pleasant 89 y.o. female in NAD. AAO x 3.  Vascular Examination: CFT <4 seconds b/l. DP pulses diminished b/l. PT pulses diminished b/l. Digital hair absent. Skin temperature gradient warm to cool b/l. No ischemia or gangrene. No cyanosis or clubbing noted b/l.    Neurological Examination: Sensation grossly intact b/l with 10 gram monofilament. Vibratory sensation intact b/l.   Dermatological Examination: Pedal skin thin, shiny and atrophic b/l. No open wounds. No interdigital macerations.   Toenails 1-5 b/l thick, discolored, elongated with subungual debris and pain on dorsal palpation.   No hyperkeratotic nor porokeratotic lesions present on today's visit.  Musculoskeletal Examination: Muscle strength 5/5 to all lower extremity muscle groups bilaterally. No pain, crepitus or joint limitation noted with ROM bilateral LE. Utilizes rollator for ambulation assistance.  Radiographs: None      Assessment/Plan: 1. Pain due to onychomycosis of toenails of both feet   2. Type II diabetes mellitus with peripheral circulatory disorder [facility])   Patient was evaluated and treated. All patient's and/or POA's questions/concerns addressed on today's visit. Mycotic toenails  1-5 debrided in length and girth without incident. Continue soft, supportive shoe gear daily. Report any pedal injuries to medical professional. Call office if there are any quesitons/concerns. -Patient/POA to call should there be question/concern in the interim.   Return in about 3 months (around 01/01/2024).  Sara Reilly, DPM      [facility] LOCATION: [address], [place] [address]                   Office [phone]   [facility] LOCATION: [address] [place], [place] [address] Office [phone]

## 2024-01-14 ENCOUNTER — Ambulatory Visit: Admitting: Podiatry

## 2024-01-15 ENCOUNTER — Ambulatory Visit (INDEPENDENT_AMBULATORY_CARE_PROVIDER_SITE_OTHER): Admitting: Podiatry

## 2024-01-15 DIAGNOSIS — Z91198 Patient's noncompliance with other medical treatment and regimen for other reason: Secondary | ICD-10-CM

## 2024-01-16 NOTE — Progress Notes (Signed)
 1. Failure to attend appointment with reason given    Appointment canceled by patient.
